# Patient Record
Sex: Female | Born: 1945 | Race: White | Hispanic: No | Smoking: Former smoker
Health system: Southern US, Community
[De-identification: ages and names within clinical notes are randomized; demographics above are authoritative.]

## PROBLEM LIST (undated history)

## (undated) DIAGNOSIS — F32A Depression, unspecified: Secondary | ICD-10-CM

## (undated) DIAGNOSIS — K219 Gastro-esophageal reflux disease without esophagitis: Secondary | ICD-10-CM

## (undated) DIAGNOSIS — E785 Hyperlipidemia, unspecified: Secondary | ICD-10-CM

## (undated) DIAGNOSIS — M810 Age-related osteoporosis without current pathological fracture: Secondary | ICD-10-CM

## (undated) DIAGNOSIS — Z800 Family history of malignant neoplasm of digestive organs: Secondary | ICD-10-CM

## (undated) DIAGNOSIS — C50919 Malignant neoplasm of unspecified site of unspecified female breast: Secondary | ICD-10-CM

## (undated) DIAGNOSIS — Z803 Family history of malignant neoplasm of breast: Secondary | ICD-10-CM

## (undated) DIAGNOSIS — T8859XA Other complications of anesthesia, initial encounter: Secondary | ICD-10-CM

## (undated) DIAGNOSIS — Z86718 Personal history of other venous thrombosis and embolism: Secondary | ICD-10-CM

## (undated) DIAGNOSIS — D0510 Intraductal carcinoma in situ of unspecified breast: Secondary | ICD-10-CM

## (undated) DIAGNOSIS — K5792 Diverticulitis of intestine, part unspecified, without perforation or abscess without bleeding: Secondary | ICD-10-CM

## (undated) DIAGNOSIS — I10 Essential (primary) hypertension: Secondary | ICD-10-CM

## (undated) DIAGNOSIS — M1990 Unspecified osteoarthritis, unspecified site: Secondary | ICD-10-CM

## (undated) DIAGNOSIS — F419 Anxiety disorder, unspecified: Secondary | ICD-10-CM

## (undated) DIAGNOSIS — Z8489 Family history of other specified conditions: Secondary | ICD-10-CM

## (undated) DIAGNOSIS — Z9889 Other specified postprocedural states: Secondary | ICD-10-CM

## (undated) HISTORY — DX: Gastro-esophageal reflux disease without esophagitis: K21.9

## (undated) HISTORY — DX: Essential (primary) hypertension: I10

## (undated) HISTORY — PX: ABDOMINAL ADHESION SURGERY: SHX90

## (undated) HISTORY — DX: Diverticulitis of intestine, part unspecified, without perforation or abscess without bleeding: K57.92

## (undated) HISTORY — DX: Malignant neoplasm of unspecified site of unspecified female breast: C50.919

## (undated) HISTORY — PX: BREAST SURGERY: SHX581

## (undated) HISTORY — DX: Unspecified osteoarthritis, unspecified site: M19.90

## (undated) HISTORY — DX: Family history of malignant neoplasm of digestive organs: Z80.0

## (undated) HISTORY — DX: Family history of malignant neoplasm of breast: Z80.3

## (undated) HISTORY — DX: Personal history of other venous thrombosis and embolism: Z86.718

## (undated) HISTORY — DX: Age-related osteoporosis without current pathological fracture: M81.0

## (undated) HISTORY — PX: BREAST EXCISIONAL BIOPSY: SUR124

## (undated) HISTORY — DX: Hyperlipidemia, unspecified: E78.5

## (undated) HISTORY — DX: Intraductal carcinoma in situ of unspecified breast: D05.10

---

## 1993-10-13 HISTORY — PX: ABDOMINAL HYSTERECTOMY: SHX81

## 1994-10-13 HISTORY — PX: MASTECTOMY: SHX3

## 1998-03-22 DIAGNOSIS — D0510 Intraductal carcinoma in situ of unspecified breast: Secondary | ICD-10-CM

## 1998-03-22 HISTORY — DX: Intraductal carcinoma in situ of unspecified breast: D05.10

## 1999-03-27 ENCOUNTER — Ambulatory Visit (HOSPITAL_COMMUNITY): Admission: RE | Admit: 1999-03-27 | Discharge: 1999-03-27 | Payer: Self-pay | Admitting: Gastroenterology

## 1999-06-05 ENCOUNTER — Other Ambulatory Visit: Admission: RE | Admit: 1999-06-05 | Discharge: 1999-06-05 | Payer: Self-pay | Admitting: Obstetrics & Gynecology

## 1999-12-06 ENCOUNTER — Ambulatory Visit (HOSPITAL_COMMUNITY): Admission: RE | Admit: 1999-12-06 | Discharge: 1999-12-06 | Payer: Self-pay | Admitting: Family Medicine

## 2000-02-03 ENCOUNTER — Encounter: Admission: RE | Admit: 2000-02-03 | Discharge: 2000-02-03 | Payer: Self-pay | Admitting: General Surgery

## 2000-02-03 ENCOUNTER — Encounter: Payer: Self-pay | Admitting: General Surgery

## 2000-08-21 ENCOUNTER — Other Ambulatory Visit: Admission: RE | Admit: 2000-08-21 | Discharge: 2000-08-21 | Payer: Self-pay | Admitting: Obstetrics & Gynecology

## 2001-02-03 ENCOUNTER — Encounter: Admission: RE | Admit: 2001-02-03 | Discharge: 2001-02-03 | Payer: Self-pay | Admitting: General Surgery

## 2001-02-03 ENCOUNTER — Encounter: Payer: Self-pay | Admitting: General Surgery

## 2002-03-01 ENCOUNTER — Encounter: Payer: Self-pay | Admitting: General Surgery

## 2002-03-01 ENCOUNTER — Encounter: Admission: RE | Admit: 2002-03-01 | Discharge: 2002-03-01 | Payer: Self-pay | Admitting: General Surgery

## 2002-07-18 ENCOUNTER — Ambulatory Visit (HOSPITAL_COMMUNITY): Admission: RE | Admit: 2002-07-18 | Discharge: 2002-07-18 | Payer: Self-pay | Admitting: Gastroenterology

## 2002-08-26 ENCOUNTER — Other Ambulatory Visit: Admission: RE | Admit: 2002-08-26 | Discharge: 2002-08-26 | Payer: Self-pay | Admitting: Obstetrics & Gynecology

## 2003-03-07 ENCOUNTER — Encounter: Payer: Self-pay | Admitting: General Surgery

## 2003-03-07 ENCOUNTER — Encounter: Admission: RE | Admit: 2003-03-07 | Discharge: 2003-03-07 | Payer: Self-pay | Admitting: General Surgery

## 2004-03-05 ENCOUNTER — Encounter: Admission: RE | Admit: 2004-03-05 | Discharge: 2004-03-05 | Payer: Self-pay | Admitting: General Surgery

## 2004-03-28 ENCOUNTER — Other Ambulatory Visit: Admission: RE | Admit: 2004-03-28 | Discharge: 2004-03-28 | Payer: Self-pay | Admitting: Obstetrics & Gynecology

## 2005-03-11 ENCOUNTER — Encounter: Admission: RE | Admit: 2005-03-11 | Discharge: 2005-03-11 | Payer: Self-pay | Admitting: General Surgery

## 2005-05-15 ENCOUNTER — Other Ambulatory Visit: Admission: RE | Admit: 2005-05-15 | Discharge: 2005-05-15 | Payer: Self-pay | Admitting: Obstetrics & Gynecology

## 2006-03-18 ENCOUNTER — Encounter: Admission: RE | Admit: 2006-03-18 | Discharge: 2006-03-18 | Payer: Self-pay | Admitting: General Surgery

## 2006-04-14 ENCOUNTER — Emergency Department: Payer: Self-pay | Admitting: Emergency Medicine

## 2006-04-14 ENCOUNTER — Other Ambulatory Visit: Payer: Self-pay

## 2006-08-15 ENCOUNTER — Other Ambulatory Visit: Payer: Self-pay

## 2006-08-15 ENCOUNTER — Observation Stay: Payer: Self-pay | Admitting: Internal Medicine

## 2006-10-08 ENCOUNTER — Encounter: Admission: RE | Admit: 2006-10-08 | Discharge: 2006-10-08 | Payer: Self-pay | Admitting: Gastroenterology

## 2007-03-22 ENCOUNTER — Encounter: Admission: RE | Admit: 2007-03-22 | Discharge: 2007-03-22 | Payer: Self-pay | Admitting: General Surgery

## 2007-07-04 ENCOUNTER — Ambulatory Visit: Payer: Self-pay | Admitting: Internal Medicine

## 2007-08-18 ENCOUNTER — Encounter: Admission: RE | Admit: 2007-08-18 | Discharge: 2007-08-18 | Payer: Self-pay | Admitting: Obstetrics & Gynecology

## 2007-08-22 ENCOUNTER — Encounter: Admission: RE | Admit: 2007-08-22 | Discharge: 2007-08-22 | Payer: Self-pay | Admitting: Obstetrics & Gynecology

## 2008-03-20 ENCOUNTER — Encounter: Admission: RE | Admit: 2008-03-20 | Discharge: 2008-03-20 | Payer: Self-pay | Admitting: Surgery

## 2008-09-26 ENCOUNTER — Ambulatory Visit: Payer: Self-pay | Admitting: Orthopedic Surgery

## 2009-03-27 ENCOUNTER — Encounter: Admission: RE | Admit: 2009-03-27 | Discharge: 2009-03-27 | Payer: Self-pay | Admitting: Surgery

## 2009-10-03 ENCOUNTER — Ambulatory Visit: Payer: Self-pay | Admitting: Family Medicine

## 2009-11-16 ENCOUNTER — Ambulatory Visit: Payer: Self-pay | Admitting: Internal Medicine

## 2009-12-21 ENCOUNTER — Ambulatory Visit: Payer: Self-pay | Admitting: Family Medicine

## 2010-01-30 ENCOUNTER — Encounter: Admission: RE | Admit: 2010-01-30 | Discharge: 2010-01-30 | Payer: Self-pay | Admitting: Gastroenterology

## 2010-03-28 ENCOUNTER — Encounter: Admission: RE | Admit: 2010-03-28 | Discharge: 2010-03-28 | Payer: Self-pay | Admitting: Surgery

## 2010-04-29 ENCOUNTER — Encounter: Admission: RE | Admit: 2010-04-29 | Discharge: 2010-04-29 | Payer: Self-pay | Admitting: Surgery

## 2010-10-22 ENCOUNTER — Emergency Department: Payer: Self-pay | Admitting: Emergency Medicine

## 2010-11-12 ENCOUNTER — Ambulatory Visit: Payer: Self-pay | Admitting: Internal Medicine

## 2011-02-25 ENCOUNTER — Encounter (INDEPENDENT_AMBULATORY_CARE_PROVIDER_SITE_OTHER): Payer: Self-pay | Admitting: Surgery

## 2011-03-04 ENCOUNTER — Other Ambulatory Visit: Payer: Self-pay | Admitting: Surgery

## 2011-03-04 DIAGNOSIS — Z1231 Encounter for screening mammogram for malignant neoplasm of breast: Secondary | ICD-10-CM

## 2011-04-04 ENCOUNTER — Encounter (INDEPENDENT_AMBULATORY_CARE_PROVIDER_SITE_OTHER): Payer: Self-pay | Admitting: Surgery

## 2011-04-10 ENCOUNTER — Ambulatory Visit
Admission: RE | Admit: 2011-04-10 | Discharge: 2011-04-10 | Disposition: A | Discharge status: Home / Self Care | Payer: Self-pay | Source: Ambulatory Visit | Attending: Surgery | Admitting: Surgery

## 2011-04-10 DIAGNOSIS — Z1231 Encounter for screening mammogram for malignant neoplasm of breast: Secondary | ICD-10-CM

## 2011-04-17 ENCOUNTER — Ambulatory Visit: Payer: Self-pay

## 2011-04-17 ENCOUNTER — Encounter (INDEPENDENT_AMBULATORY_CARE_PROVIDER_SITE_OTHER): Payer: BC Managed Care – PPO | Admitting: Surgery

## 2011-04-22 ENCOUNTER — Ambulatory Visit (INDEPENDENT_AMBULATORY_CARE_PROVIDER_SITE_OTHER): Payer: BC Managed Care – PPO | Admitting: Surgery

## 2011-04-22 ENCOUNTER — Encounter (INDEPENDENT_AMBULATORY_CARE_PROVIDER_SITE_OTHER): Payer: Self-pay | Admitting: Surgery

## 2011-04-22 VITALS — BP 142/94 | HR 80 | Temp 97.6°F

## 2011-04-22 DIAGNOSIS — Z853 Personal history of malignant neoplasm of breast: Secondary | ICD-10-CM

## 2011-04-22 DIAGNOSIS — D0510 Intraductal carcinoma in situ of unspecified breast: Secondary | ICD-10-CM

## 2011-04-22 DIAGNOSIS — D0590 Unspecified type of carcinoma in situ of unspecified breast: Secondary | ICD-10-CM

## 2011-04-22 NOTE — Progress Notes (Signed)
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 She has no problems or concerns on either side.  PFSH She has had no significant changes since the last visit here.  ROS There have been no significant changes since the last visit here  General: The patient is alert, oriented, generally healty appearing, NAD. Mood and affect are normal.  Breasts : Right breast somewhat irregular but nodominant mass. S/P Tram on Left Lymphatics: She has no axillary or supraclavicular adenopathy on either side.  Extremities: Full ROM of the surgical side with no lymphedema noted.  Data Reviewed: Mammogram wnl  Impression: Stable exam  Plan: Return next year

## 2012-01-13 ENCOUNTER — Ambulatory Visit: Payer: Self-pay | Admitting: Internal Medicine

## 2012-01-25 ENCOUNTER — Ambulatory Visit: Payer: Self-pay | Admitting: Family Medicine

## 2012-03-15 ENCOUNTER — Other Ambulatory Visit (INDEPENDENT_AMBULATORY_CARE_PROVIDER_SITE_OTHER): Payer: Self-pay | Admitting: Surgery

## 2012-03-15 DIAGNOSIS — Z1231 Encounter for screening mammogram for malignant neoplasm of breast: Secondary | ICD-10-CM

## 2012-04-19 ENCOUNTER — Ambulatory Visit
Admission: RE | Admit: 2012-04-19 | Discharge: 2012-04-19 | Disposition: A | Discharge status: Home / Self Care | Payer: BC Managed Care – PPO | Source: Ambulatory Visit | Attending: Surgery | Admitting: Surgery

## 2012-04-19 DIAGNOSIS — Z1231 Encounter for screening mammogram for malignant neoplasm of breast: Secondary | ICD-10-CM

## 2012-04-22 ENCOUNTER — Encounter (INDEPENDENT_AMBULATORY_CARE_PROVIDER_SITE_OTHER): Payer: Self-pay | Admitting: Surgery

## 2012-04-22 ENCOUNTER — Ambulatory Visit (INDEPENDENT_AMBULATORY_CARE_PROVIDER_SITE_OTHER): Payer: BC Managed Care – PPO | Admitting: Surgery

## 2012-04-22 VITALS — BP 138/96 | HR 60 | Resp 14 | Ht 66.0 in | Wt 134.0 lb

## 2012-04-22 DIAGNOSIS — Z853 Personal history of malignant neoplasm of breast: Secondary | ICD-10-CM

## 2012-04-22 NOTE — Progress Notes (Signed)
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 She has no problems or concerns on either side.  PFSH She has had no significant changes since the last visit here.  ROS There have been no significant changes since the last visit here except the development of some vitelligo  General: The patient is alert, oriented, generally healty appearing, NAD. Mood and affect are normal.  Breasts : Right breast somewhat irregular but nodominant mass. S/P Tram on Left Lymphatics: She has no axillary or supraclavicular adenopathy on either side.  Extremities: Full ROM of the surgical side with no lymphedema noted.  Data Reviewed: Mammogram wnl  Impression: Stable exam  Plan: Return next year. I told her she would be fine just seeing her ob-gyn but she would like to be seen here as well

## 2012-04-22 NOTE — Patient Instructions (Signed)
Continue annual mammograms and see us again in a year  

## 2012-05-24 ENCOUNTER — Other Ambulatory Visit: Payer: Self-pay | Admitting: Gastroenterology

## 2012-05-24 DIAGNOSIS — R109 Unspecified abdominal pain: Secondary | ICD-10-CM

## 2012-05-26 ENCOUNTER — Ambulatory Visit
Admission: RE | Admit: 2012-05-26 | Discharge: 2012-05-26 | Disposition: A | Discharge status: Home / Self Care | Payer: BC Managed Care – PPO | Source: Ambulatory Visit | Attending: Gastroenterology | Admitting: Gastroenterology

## 2012-05-26 DIAGNOSIS — R109 Unspecified abdominal pain: Secondary | ICD-10-CM

## 2012-06-02 ENCOUNTER — Other Ambulatory Visit: Payer: Self-pay | Admitting: Gastroenterology

## 2012-06-02 DIAGNOSIS — R140 Abdominal distension (gaseous): Secondary | ICD-10-CM

## 2012-06-02 DIAGNOSIS — R109 Unspecified abdominal pain: Secondary | ICD-10-CM

## 2012-06-04 ENCOUNTER — Ambulatory Visit
Admission: RE | Admit: 2012-06-04 | Discharge: 2012-06-04 | Disposition: A | Discharge status: Home / Self Care | Payer: BC Managed Care – PPO | Source: Ambulatory Visit | Attending: Gastroenterology | Admitting: Gastroenterology

## 2012-06-04 ENCOUNTER — Other Ambulatory Visit: Payer: BC Managed Care – PPO

## 2012-06-04 DIAGNOSIS — R109 Unspecified abdominal pain: Secondary | ICD-10-CM

## 2012-06-04 DIAGNOSIS — R140 Abdominal distension (gaseous): Secondary | ICD-10-CM

## 2012-06-04 MED ORDER — IOHEXOL 300 MG/ML  SOLN
100.0000 mL | Freq: Once | INTRAMUSCULAR | Status: AC | PRN
  Administered 2012-06-04: 100 mL via INTRAVENOUS

## 2012-09-29 DIAGNOSIS — N952 Postmenopausal atrophic vaginitis: Secondary | ICD-10-CM | POA: Diagnosis not present

## 2012-09-29 DIAGNOSIS — R310 Gross hematuria: Secondary | ICD-10-CM | POA: Diagnosis not present

## 2012-10-04 DIAGNOSIS — R3129 Other microscopic hematuria: Secondary | ICD-10-CM | POA: Diagnosis not present

## 2012-10-26 DIAGNOSIS — N3946 Mixed incontinence: Secondary | ICD-10-CM | POA: Diagnosis not present

## 2012-10-26 DIAGNOSIS — R319 Hematuria, unspecified: Secondary | ICD-10-CM | POA: Diagnosis not present

## 2012-11-29 DIAGNOSIS — M531 Cervicobrachial syndrome: Secondary | ICD-10-CM | POA: Diagnosis not present

## 2012-11-29 DIAGNOSIS — M47812 Spondylosis without myelopathy or radiculopathy, cervical region: Secondary | ICD-10-CM | POA: Diagnosis not present

## 2012-12-27 DIAGNOSIS — N3946 Mixed incontinence: Secondary | ICD-10-CM | POA: Diagnosis not present

## 2012-12-27 DIAGNOSIS — R319 Hematuria, unspecified: Secondary | ICD-10-CM | POA: Diagnosis not present

## 2013-02-15 DIAGNOSIS — R319 Hematuria, unspecified: Secondary | ICD-10-CM | POA: Diagnosis not present

## 2013-02-15 DIAGNOSIS — N950 Postmenopausal bleeding: Secondary | ICD-10-CM | POA: Diagnosis not present

## 2013-02-15 DIAGNOSIS — N3946 Mixed incontinence: Secondary | ICD-10-CM | POA: Diagnosis not present

## 2013-03-03 DIAGNOSIS — L80 Vitiligo: Secondary | ICD-10-CM | POA: Diagnosis not present

## 2013-03-03 DIAGNOSIS — N3946 Mixed incontinence: Secondary | ICD-10-CM | POA: Diagnosis not present

## 2013-03-09 ENCOUNTER — Other Ambulatory Visit: Payer: Self-pay

## 2013-03-09 DIAGNOSIS — Z1231 Encounter for screening mammogram for malignant neoplasm of breast: Secondary | ICD-10-CM

## 2013-03-16 DIAGNOSIS — L80 Vitiligo: Secondary | ICD-10-CM | POA: Diagnosis not present

## 2013-03-23 DIAGNOSIS — L80 Vitiligo: Secondary | ICD-10-CM | POA: Diagnosis not present

## 2013-03-30 DIAGNOSIS — L80 Vitiligo: Secondary | ICD-10-CM | POA: Diagnosis not present

## 2013-04-05 DIAGNOSIS — R32 Unspecified urinary incontinence: Secondary | ICD-10-CM | POA: Diagnosis not present

## 2013-04-05 DIAGNOSIS — L80 Vitiligo: Secondary | ICD-10-CM | POA: Diagnosis not present

## 2013-04-11 DIAGNOSIS — L80 Vitiligo: Secondary | ICD-10-CM | POA: Diagnosis not present

## 2013-04-18 ENCOUNTER — Ambulatory Visit
Admission: RE | Admit: 2013-04-18 | Discharge: 2013-04-18 | Disposition: A | Discharge status: Home / Self Care | Payer: Medicare Other | Source: Ambulatory Visit | Attending: Family Medicine | Admitting: Family Medicine

## 2013-04-18 ENCOUNTER — Other Ambulatory Visit: Payer: Self-pay | Admitting: Family Medicine

## 2013-04-18 DIAGNOSIS — R072 Precordial pain: Secondary | ICD-10-CM | POA: Diagnosis not present

## 2013-04-18 DIAGNOSIS — R071 Chest pain on breathing: Secondary | ICD-10-CM | POA: Diagnosis not present

## 2013-04-18 DIAGNOSIS — W19XXXA Unspecified fall, initial encounter: Secondary | ICD-10-CM

## 2013-04-18 DIAGNOSIS — M545 Low back pain: Secondary | ICD-10-CM | POA: Diagnosis not present

## 2013-04-18 DIAGNOSIS — M549 Dorsalgia, unspecified: Secondary | ICD-10-CM | POA: Diagnosis not present

## 2013-04-18 DIAGNOSIS — M47812 Spondylosis without myelopathy or radiculopathy, cervical region: Secondary | ICD-10-CM | POA: Diagnosis not present

## 2013-04-18 DIAGNOSIS — M810 Age-related osteoporosis without current pathological fracture: Secondary | ICD-10-CM | POA: Diagnosis not present

## 2013-04-18 DIAGNOSIS — IMO0002 Reserved for concepts with insufficient information to code with codable children: Secondary | ICD-10-CM | POA: Diagnosis not present

## 2013-04-21 DIAGNOSIS — L80 Vitiligo: Secondary | ICD-10-CM | POA: Diagnosis not present

## 2013-04-25 ENCOUNTER — Ambulatory Visit
Admission: RE | Admit: 2013-04-25 | Discharge: 2013-04-25 | Disposition: A | Discharge status: Home / Self Care | Payer: BC Managed Care – PPO | Source: Ambulatory Visit

## 2013-04-25 ENCOUNTER — Other Ambulatory Visit: Payer: Self-pay

## 2013-04-25 DIAGNOSIS — Z1231 Encounter for screening mammogram for malignant neoplasm of breast: Secondary | ICD-10-CM

## 2013-04-27 DIAGNOSIS — L80 Vitiligo: Secondary | ICD-10-CM | POA: Diagnosis not present

## 2013-04-28 ENCOUNTER — Encounter (INDEPENDENT_AMBULATORY_CARE_PROVIDER_SITE_OTHER): Payer: Self-pay | Admitting: Surgery

## 2013-04-28 ENCOUNTER — Ambulatory Visit (INDEPENDENT_AMBULATORY_CARE_PROVIDER_SITE_OTHER): Payer: BC Managed Care – PPO | Admitting: Surgery

## 2013-04-28 VITALS — BP 150/82 | HR 88 | Resp 16 | Ht 66.5 in | Wt 136.0 lb

## 2013-04-28 DIAGNOSIS — D0512 Intraductal carcinoma in situ of left breast: Secondary | ICD-10-CM

## 2013-04-28 DIAGNOSIS — D0590 Unspecified type of carcinoma in situ of unspecified breast: Secondary | ICD-10-CM | POA: Diagnosis not present

## 2013-04-28 NOTE — Patient Instructions (Signed)
Continue annual mammograms  

## 2013-04-28 NOTE — Progress Notes (Signed)
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 She has no problems or concerns on either side.  PFSH She has had no significant changes since the last visit here.  ROS There have been no significant changes since the last visit here except the development of some vitelligo  General: The patient is alert, oriented, generally healty appearing, NAD. Mood and affect are normal.  Breasts : Right breast somewhat irregular but nodominant mass. S/P Tram on Left Lymphatics: She has no axillary or supraclavicular adenopathy on either side.  Extremities: Full ROM of the surgical side with no lymphedema noted.  Data Reviewed: Mammogram: DIGITAL SCREENING UNILATERAL RIGHT MAMMOGRAM WITH CAD  Comparison: Previous exam(s).  FINDINGS:  ACR Breast Density Category c: The breast tissue is  heterogeneously dense, which may obscure small masses.  There are no findings suspicious for malignancy.  Images were processed with CAD.  IMPRESSION:  No mammographic evidence of malignancy.  A result letter of this screening mammogram will be mailed directly  to the patient.  RECOMMENDATION:  Screening mammogram in one year. (Code:SM-B-01Y)  BI-RADS CATEGORY 1: Negative.  Original Report Authenticated By: Sherian Rein, M.D.   Impression: Stable exam  Plan: Return next year. I told her she would be fine just seeing her ob-gyn but she would like to be seen here as well

## 2013-05-02 DIAGNOSIS — L80 Vitiligo: Secondary | ICD-10-CM | POA: Diagnosis not present

## 2013-05-02 DIAGNOSIS — R32 Unspecified urinary incontinence: Secondary | ICD-10-CM | POA: Diagnosis not present

## 2013-05-13 DIAGNOSIS — L80 Vitiligo: Secondary | ICD-10-CM | POA: Diagnosis not present

## 2013-05-17 DIAGNOSIS — L80 Vitiligo: Secondary | ICD-10-CM | POA: Diagnosis not present

## 2013-05-19 DIAGNOSIS — M810 Age-related osteoporosis without current pathological fracture: Secondary | ICD-10-CM | POA: Diagnosis not present

## 2013-05-26 DIAGNOSIS — L80 Vitiligo: Secondary | ICD-10-CM | POA: Diagnosis not present

## 2013-06-02 DIAGNOSIS — L80 Vitiligo: Secondary | ICD-10-CM | POA: Diagnosis not present

## 2013-06-10 DIAGNOSIS — L80 Vitiligo: Secondary | ICD-10-CM | POA: Diagnosis not present

## 2013-06-16 DIAGNOSIS — L80 Vitiligo: Secondary | ICD-10-CM | POA: Diagnosis not present

## 2013-06-20 DIAGNOSIS — L80 Vitiligo: Secondary | ICD-10-CM | POA: Diagnosis not present

## 2013-06-30 DIAGNOSIS — N3946 Mixed incontinence: Secondary | ICD-10-CM | POA: Diagnosis not present

## 2013-07-13 DIAGNOSIS — M62838 Other muscle spasm: Secondary | ICD-10-CM | POA: Diagnosis not present

## 2013-08-08 DIAGNOSIS — M6281 Muscle weakness (generalized): Secondary | ICD-10-CM | POA: Diagnosis not present

## 2013-09-05 DIAGNOSIS — K219 Gastro-esophageal reflux disease without esophagitis: Secondary | ICD-10-CM | POA: Diagnosis not present

## 2013-09-05 DIAGNOSIS — Z23 Encounter for immunization: Secondary | ICD-10-CM | POA: Diagnosis not present

## 2013-09-05 DIAGNOSIS — E7800 Pure hypercholesterolemia, unspecified: Secondary | ICD-10-CM | POA: Diagnosis not present

## 2013-09-05 DIAGNOSIS — M810 Age-related osteoporosis without current pathological fracture: Secondary | ICD-10-CM | POA: Diagnosis not present

## 2013-09-05 DIAGNOSIS — I10 Essential (primary) hypertension: Secondary | ICD-10-CM | POA: Diagnosis not present

## 2013-09-05 DIAGNOSIS — E559 Vitamin D deficiency, unspecified: Secondary | ICD-10-CM | POA: Diagnosis not present

## 2013-09-15 DIAGNOSIS — I10 Essential (primary) hypertension: Secondary | ICD-10-CM | POA: Diagnosis not present

## 2013-09-15 DIAGNOSIS — E559 Vitamin D deficiency, unspecified: Secondary | ICD-10-CM | POA: Diagnosis not present

## 2013-09-15 DIAGNOSIS — E7800 Pure hypercholesterolemia, unspecified: Secondary | ICD-10-CM | POA: Diagnosis not present

## 2013-09-22 DIAGNOSIS — L820 Inflamed seborrheic keratosis: Secondary | ICD-10-CM | POA: Diagnosis not present

## 2013-11-09 DIAGNOSIS — Z853 Personal history of malignant neoplasm of breast: Secondary | ICD-10-CM | POA: Diagnosis not present

## 2013-11-09 DIAGNOSIS — Z124 Encounter for screening for malignant neoplasm of cervix: Secondary | ICD-10-CM | POA: Diagnosis not present

## 2013-11-09 DIAGNOSIS — Z130 Encounter for screening for diseases of the blood and blood-forming organs and certain disorders involving the immune mechanism: Secondary | ICD-10-CM | POA: Diagnosis not present

## 2013-11-09 DIAGNOSIS — Z01419 Encounter for gynecological examination (general) (routine) without abnormal findings: Secondary | ICD-10-CM | POA: Diagnosis not present

## 2013-11-09 DIAGNOSIS — Z803 Family history of malignant neoplasm of breast: Secondary | ICD-10-CM | POA: Diagnosis not present

## 2013-11-23 ENCOUNTER — Emergency Department: Payer: Self-pay | Admitting: Emergency Medicine

## 2013-11-23 DIAGNOSIS — Z79899 Other long term (current) drug therapy: Secondary | ICD-10-CM | POA: Diagnosis not present

## 2013-11-23 DIAGNOSIS — M549 Dorsalgia, unspecified: Secondary | ICD-10-CM | POA: Diagnosis not present

## 2013-11-23 DIAGNOSIS — K5730 Diverticulosis of large intestine without perforation or abscess without bleeding: Secondary | ICD-10-CM | POA: Diagnosis not present

## 2013-11-23 DIAGNOSIS — Z853 Personal history of malignant neoplasm of breast: Secondary | ICD-10-CM | POA: Diagnosis not present

## 2013-11-23 DIAGNOSIS — F909 Attention-deficit hyperactivity disorder, unspecified type: Secondary | ICD-10-CM | POA: Diagnosis not present

## 2013-11-23 DIAGNOSIS — F3289 Other specified depressive episodes: Secondary | ICD-10-CM | POA: Diagnosis not present

## 2013-11-23 DIAGNOSIS — R109 Unspecified abdominal pain: Secondary | ICD-10-CM | POA: Diagnosis not present

## 2013-11-23 DIAGNOSIS — F329 Major depressive disorder, single episode, unspecified: Secondary | ICD-10-CM | POA: Diagnosis not present

## 2013-11-23 DIAGNOSIS — R110 Nausea: Secondary | ICD-10-CM | POA: Diagnosis not present

## 2013-11-23 DIAGNOSIS — I10 Essential (primary) hypertension: Secondary | ICD-10-CM | POA: Diagnosis not present

## 2013-11-23 LAB — URINALYSIS, COMPLETE
Bacteria: NONE SEEN
Bilirubin,UR: NEGATIVE
Glucose,UR: NEGATIVE mg/dL (ref 0–75)
KETONE: NEGATIVE
Leukocyte Esterase: NEGATIVE
Nitrite: NEGATIVE
Ph: 6 (ref 4.5–8.0)
Protein: NEGATIVE
RBC,UR: 1 /HPF (ref 0–5)
Specific Gravity: 1.02 (ref 1.003–1.030)
Squamous Epithelial: <1

## 2013-11-23 LAB — COMPREHENSIVE METABOLIC PANEL
ALK PHOS: 101 U/L
ALT: 28 U/L (ref 12–78)
AST: 19 U/L (ref 15–37)
Albumin: 3.9 g/dL (ref 3.4–5.0)
Anion Gap: 7 (ref 7–16)
BILIRUBIN TOTAL: 0.5 mg/dL (ref 0.2–1.0)
BUN: 13 mg/dL (ref 7–18)
CALCIUM: 9 mg/dL (ref 8.5–10.1)
CO2: 28 mmol/L (ref 21–32)
CREATININE: 0.66 mg/dL (ref 0.60–1.30)
Chloride: 102 mmol/L (ref 98–107)
Glucose: 97 mg/dL (ref 65–99)
OSMOLALITY: 274 (ref 275–301)
Potassium: 3.6 mmol/L (ref 3.5–5.1)
SODIUM: 137 mmol/L (ref 136–145)
TOTAL PROTEIN: 7.2 g/dL (ref 6.4–8.2)

## 2013-11-23 LAB — CBC WITH DIFFERENTIAL/PLATELET
Basophil #: 0.1 10*3/uL (ref 0.0–0.1)
Basophil %: 0.9 %
EOS ABS: 0.2 10*3/uL (ref 0.0–0.7)
Eosinophil %: 2.9 %
HCT: 46.1 % (ref 35.0–47.0)
HGB: 15.4 g/dL (ref 12.0–16.0)
Lymphocyte #: 1.8 10*3/uL (ref 1.0–3.6)
Lymphocyte %: 28.4 %
MCH: 28.9 pg (ref 26.0–34.0)
MCHC: 33.5 g/dL (ref 32.0–36.0)
MCV: 86 fL (ref 80–100)
MONO ABS: 0.6 x10 3/mm (ref 0.2–0.9)
MONOS PCT: 9 %
NEUTROS ABS: 3.7 10*3/uL (ref 1.4–6.5)
NEUTROS PCT: 58.8 %
Platelet: 232 10*3/uL (ref 150–440)
RBC: 5.34 10*6/uL — ABNORMAL HIGH (ref 3.80–5.20)
RDW: 12.7 % (ref 11.5–14.5)
WBC: 6.3 10*3/uL (ref 3.6–11.0)

## 2013-11-23 LAB — PROTIME-INR
INR: 1
Prothrombin Time: 13.2 secs (ref 11.5–14.7)

## 2013-11-30 DIAGNOSIS — IMO0002 Reserved for concepts with insufficient information to code with codable children: Secondary | ICD-10-CM | POA: Diagnosis not present

## 2014-02-06 DIAGNOSIS — H2510 Age-related nuclear cataract, unspecified eye: Secondary | ICD-10-CM | POA: Diagnosis not present

## 2014-04-06 ENCOUNTER — Other Ambulatory Visit: Payer: Self-pay

## 2014-04-06 DIAGNOSIS — Z9012 Acquired absence of left breast and nipple: Secondary | ICD-10-CM

## 2014-04-06 DIAGNOSIS — Z1231 Encounter for screening mammogram for malignant neoplasm of breast: Secondary | ICD-10-CM

## 2014-05-08 DIAGNOSIS — L259 Unspecified contact dermatitis, unspecified cause: Secondary | ICD-10-CM | POA: Diagnosis not present

## 2014-05-08 DIAGNOSIS — IMO0002 Reserved for concepts with insufficient information to code with codable children: Secondary | ICD-10-CM | POA: Diagnosis not present

## 2014-05-17 DIAGNOSIS — M810 Age-related osteoporosis without current pathological fracture: Secondary | ICD-10-CM | POA: Diagnosis not present

## 2014-05-17 DIAGNOSIS — F329 Major depressive disorder, single episode, unspecified: Secondary | ICD-10-CM | POA: Diagnosis not present

## 2014-05-17 DIAGNOSIS — E7800 Pure hypercholesterolemia, unspecified: Secondary | ICD-10-CM | POA: Diagnosis not present

## 2014-05-17 DIAGNOSIS — F3289 Other specified depressive episodes: Secondary | ICD-10-CM | POA: Diagnosis not present

## 2014-05-17 DIAGNOSIS — E559 Vitamin D deficiency, unspecified: Secondary | ICD-10-CM | POA: Diagnosis not present

## 2014-05-17 DIAGNOSIS — I10 Essential (primary) hypertension: Secondary | ICD-10-CM | POA: Diagnosis not present

## 2014-05-17 DIAGNOSIS — K219 Gastro-esophageal reflux disease without esophagitis: Secondary | ICD-10-CM | POA: Diagnosis not present

## 2014-06-05 ENCOUNTER — Ambulatory Visit
Admission: RE | Admit: 2014-06-05 | Discharge: 2014-06-05 | Disposition: A | Discharge status: Home / Self Care | Payer: Medicare Other | Source: Ambulatory Visit

## 2014-06-05 DIAGNOSIS — Z1231 Encounter for screening mammogram for malignant neoplasm of breast: Secondary | ICD-10-CM

## 2014-06-05 DIAGNOSIS — Z9012 Acquired absence of left breast and nipple: Secondary | ICD-10-CM

## 2014-06-08 ENCOUNTER — Ambulatory Visit (INDEPENDENT_AMBULATORY_CARE_PROVIDER_SITE_OTHER): Payer: BC Managed Care – PPO | Admitting: General Surgery

## 2014-07-03 ENCOUNTER — Ambulatory Visit (INDEPENDENT_AMBULATORY_CARE_PROVIDER_SITE_OTHER): Payer: BC Managed Care – PPO | Admitting: General Surgery

## 2014-07-03 DIAGNOSIS — D0590 Unspecified type of carcinoma in situ of unspecified breast: Secondary | ICD-10-CM | POA: Diagnosis not present

## 2014-07-21 DIAGNOSIS — N950 Postmenopausal bleeding: Secondary | ICD-10-CM | POA: Diagnosis not present

## 2014-07-25 ENCOUNTER — Ambulatory Visit (INDEPENDENT_AMBULATORY_CARE_PROVIDER_SITE_OTHER): Payer: BC Managed Care – PPO | Admitting: General Surgery

## 2014-08-03 DIAGNOSIS — R0789 Other chest pain: Secondary | ICD-10-CM | POA: Diagnosis not present

## 2014-08-03 DIAGNOSIS — N939 Abnormal uterine and vaginal bleeding, unspecified: Secondary | ICD-10-CM | POA: Diagnosis not present

## 2014-08-03 DIAGNOSIS — N898 Other specified noninflammatory disorders of vagina: Secondary | ICD-10-CM | POA: Diagnosis not present

## 2014-08-04 DIAGNOSIS — N898 Other specified noninflammatory disorders of vagina: Secondary | ICD-10-CM | POA: Diagnosis not present

## 2014-08-04 DIAGNOSIS — N842 Polyp of vagina: Secondary | ICD-10-CM | POA: Diagnosis not present

## 2014-08-04 DIAGNOSIS — N950 Postmenopausal bleeding: Secondary | ICD-10-CM | POA: Diagnosis not present

## 2014-08-04 DIAGNOSIS — N882 Stricture and stenosis of cervix uteri: Secondary | ICD-10-CM | POA: Diagnosis not present

## 2014-08-04 DIAGNOSIS — N895 Stricture and atresia of vagina: Secondary | ICD-10-CM | POA: Diagnosis not present

## 2014-08-08 ENCOUNTER — Ambulatory Visit (INDEPENDENT_AMBULATORY_CARE_PROVIDER_SITE_OTHER): Payer: Medicare Other | Admitting: Cardiology

## 2014-08-08 ENCOUNTER — Encounter: Payer: Self-pay | Admitting: Cardiology

## 2014-08-08 VITALS — BP 126/74 | HR 69 | Ht 65.0 in | Wt 140.0 lb

## 2014-08-08 DIAGNOSIS — I10 Essential (primary) hypertension: Secondary | ICD-10-CM

## 2014-08-08 DIAGNOSIS — E7800 Pure hypercholesterolemia, unspecified: Secondary | ICD-10-CM | POA: Insufficient documentation

## 2014-08-08 DIAGNOSIS — R0789 Other chest pain: Secondary | ICD-10-CM

## 2014-08-08 DIAGNOSIS — R0609 Other forms of dyspnea: Secondary | ICD-10-CM | POA: Diagnosis not present

## 2014-08-08 NOTE — Progress Notes (Signed)
[place] Date of Birth:  1946/08/31 [facility] [address] [place], [place]  [address] [phone]        Fax   [phone]   History of Present Illness: This pleasant 68 year old woman is seen at the request of Dr. Darcus Austin for evaluation of chest tightness.  The patient does not have any history of known heart disease she does have a history of hypercholesterolemia and high blood pressure.  She smoked about 40 years ago but not since.  She has a remote history of paroxysmal atrial tachycardia.  She has occasional episodes of hypoglycemia but is not known to be diabetic.  She has a past history of breast cancer of the left breast in 1997.  There has been no evidence of recurrence. She was in her usual state of health until recently when while pushing her granddaughter in a stroller up a hill she developed unusual exertional dyspnea.  The next day the patient was again exerting herself and this time felt a heavy pressure-like sensation in the precordial area without radiation to the arms or neck or back.  This was also associated with exertional dyspnea and was relieved by rest.  She was told by her daughter who is a Equities trader that occasionally the daughter will note that the patient develops unexplained pallor and mild diaphoresis. Her family history reveals her mother died at age 65 of heart failure.  Her father died at age 59 of colon cancer. Social history reveals that she is a retired Pharmacist, hospital at [employer].  She taught elementary school teaching. The patient is a widow.  Her husband died at age 68 of coronary artery disease.  Current Outpatient Prescriptions  Medication Sig Dispense Refill  . aspirin 81 MG tablet Take 81 mg by mouth daily.      Marland Kitchen atenolol (TENORMIN) 25 MG tablet Take 25 mg by mouth daily.      Marland Kitchen buPROPion (WELLBUTRIN XL) 300 MG 24 hr tablet Take 300 mg by mouth daily.        Marland Kitchen ELIDEL 1 % cream       .  estradiol (ESTRACE) 0.1 MG/GM vaginal cream Place 2 g vaginally 2 (two) times a week.        Marland Kitchen FLUoxetine (PROZAC) 10 MG capsule Take 10 mg by mouth daily.        Marland Kitchen omeprazole (PRILOSEC) 20 MG capsule Take 20 mg by mouth daily.      . polyethylene glycol (MIRALAX / GLYCOLAX) packet Take 17 g by mouth daily.      . rosuvastatin (CRESTOR) 20 MG tablet Take 20 mg by mouth daily.      . traZODone (DESYREL) 50 MG tablet Take 50 mg by mouth as needed for sleep.      . Vitamin D, Ergocalciferol, (DRISDOL) 50000 UNITS CAPS        No current facility-administered medications for this visit.    Allergies  Allergen Reactions  . Tape Rash    Patient Active Problem List   Diagnosis Date Noted  . Chest discomfort 08/08/2014  . Hypercholesterolemia 08/08/2014  . Essential hypertension 08/08/2014  . Dyspnea on exertion 08/08/2014  . [place] treated with mastectomy and tram reconstruction 03/22/1998    History  Smoking status  . Former Smoker  . Quit date: 04/28/1978  Smokeless tobacco  . Not on file    Comment: 30+ years ago, 1 pp Week for 1.5 years  History  Alcohol Use  . 0.0 oz/week  . 2-3 Glasses of wine per week    Comment: 1-2 GLASSES 3 TIMES A WEEK    Family History  Problem Relation Age of Onset  . Colon cancer Father   . Breast cancer Maternal Aunt   . Breast cancer Paternal Grandmother     Review of Systems: Constitutional: no fever chills diaphoresis or fatigue or change in weight.  Head and neck: no hearing loss, no epistaxis, no photophobia or visual disturbance. Respiratory: No cough, positive for exertional dyspnea and chest tightness Cardiovascular: No chest pain peripheral edema, palpitations. Gastrointestinal: No abdominal distention, no abdominal pain, no change in bowel habits hematochezia or melena. Genitourinary: No dysuria, no frequency, no urgency, no nocturia. Musculoskeletal:No arthralgias, no back pain, no gait disturbance or  myalgias. Neurological: No dizziness, no headaches, no numbness, no seizures, no syncope, no weakness, no tremors. Hematologic: No lymphadenopathy, no easy bruising. Psychiatric: No confusion, no hallucinations, no sleep disturbance.    Physical Exam: Filed Vitals:   08/08/14 1438  BP: 126/74  Pulse: 69  The patient appears to be in no distress.  Head and neck exam reveals that the pupils are equal and reactive.  The extraocular movements are full.  There is no scleral icterus.  Mouth and pharynx are benign.  No lymphadenopathy.  No carotid bruits.  The jugular venous pressure is normal.  Thyroid is not enlarged or tender.  Chest is clear to percussion and auscultation.  No rales or rhonchi.  Expansion of the chest is symmetrical.  Heart reveals no abnormal lift or heave.  First and second heart sounds are normal.  There is no murmur gallop rub or click.  The abdomen is soft and nontender.  Bowel sounds are normoactive.  There is no hepatosplenomegaly or mass.  There are no abdominal bruits.  Extremities reveal no phlebitis or edema.  Pedal pulses are good.  There is no cyanosis or clubbing.  Neurologic exam is normal strength and no lateralizing weakness.  No sensory deficits.  Integument reveals no rash  EKG today shows normal sinus rhythm and low voltage QRS and poor R-wave progression in V3, cannot rule out anterior infarct age undetermined.  There are also narrow Q waves in the inferior leads.  Assessment / Plan: 1. exertional chest tightness and dyspnea, rule out angina pectoris 2. Hypercholesterolemia 3. essential hypertension 4. remote history of breast cancer left breast 1997 5. history of GERD  Disposition: We will have the patient return for a treadmill Myoview stress test to evaluate her recent symptoms further. We will also start aspirin 81 mg daily. Many thanks for the opportunity to see this pleasant woman.  I will be in touch regarding the results of her Myoview  stress. Recheck here when necessary.

## 2014-08-08 NOTE — Patient Instructions (Signed)
START ASPIRIN 81 MG DAILY   Your physician has requested that you have en exercise stress myoview. For further information please visit [email or web address]. Please follow instruction sheet, as given.  Follow up as needed 

## 2014-08-14 DIAGNOSIS — L80 Vitiligo: Secondary | ICD-10-CM | POA: Diagnosis not present

## 2014-08-14 DIAGNOSIS — L03011 Cellulitis of right finger: Secondary | ICD-10-CM | POA: Diagnosis not present

## 2014-08-17 ENCOUNTER — Ambulatory Visit (HOSPITAL_COMMUNITY): Payer: Medicare Other | Attending: Cardiology | Admitting: Radiology

## 2014-08-17 DIAGNOSIS — R0789 Other chest pain: Secondary | ICD-10-CM | POA: Insufficient documentation

## 2014-08-17 DIAGNOSIS — I10 Essential (primary) hypertension: Secondary | ICD-10-CM | POA: Diagnosis not present

## 2014-08-17 DIAGNOSIS — E7800 Pure hypercholesterolemia, unspecified: Secondary | ICD-10-CM

## 2014-08-17 DIAGNOSIS — R0609 Other forms of dyspnea: Secondary | ICD-10-CM | POA: Diagnosis not present

## 2014-08-17 DIAGNOSIS — R002 Palpitations: Secondary | ICD-10-CM | POA: Insufficient documentation

## 2014-08-17 MED ORDER — TECHNETIUM TC 99M SESTAMIBI GENERIC - CARDIOLITE
30.0000 | Freq: Once | INTRAVENOUS | Status: AC | PRN
  Administered 2014-08-17: 30 via INTRAVENOUS

## 2014-08-17 MED ORDER — TECHNETIUM TC 99M SESTAMIBI GENERIC - CARDIOLITE
10.0000 | Freq: Once | INTRAVENOUS | Status: AC | PRN
  Administered 2014-08-17: 10 via INTRAVENOUS

## 2014-08-17 NOTE — Progress Notes (Signed)
[facility] [address] [place], [place] [address] [phone]    Cardiology Nuclear Med Study  Christina Knight is a 68 y.o. female     MRN : [id number]     DOB: 14-Mar-1946  Procedure Date: 08/17/2014  Nuclear Med Background Indication for Stress Test:  Evaluation for Ischemia History:  No prior known history of CAD Cardiac Risk Factors: Hypertension  Symptoms: Chest Tightness with exertion (last occurrence one month ago), Upper (L) sided back pain with/without exertion, DOE and Palpitations   Nuclear Pre-Procedure Caffeine/Decaff Intake:  None NPO After: 8:00pm   Lungs:  clear O2 Sat: 98% on room air. IV 0.9% NS with Angio Cath:  22g  IV Site: R Forearm  IV Started by:  Crissie Figures, RN  Chest Size (in):  36 Cup Size: D  Height: 5\' 5"  (1.651 m)  Weight:  139 lb (63.05 kg)  BMI:  Body mass index is 23.13 kg/(m^2). Tech Comments:  Atenolol held x 24 hrs    Nuclear Med Study 1 or 2 day study: 1 day  Stress Test Type:  Stress  Reading MD: N/A  Order Authorizing Provider:  Darlin Coco, MD  Resting Radionuclide: Technetium 9m Sestamibi  Resting Radionuclide Dose: 11.0 mCi   Stress Radionuclide:  Technetium 70m Sestamibi  Stress Radionuclide Dose: 33.0 mCi           Stress Protocol Rest HR: 81 Stress HR: 144  Rest BP: 149/92 Stress BP: 193/89  Exercise Time (min): 5:30 METS: 7.0   Predicted Max HR: 154 bpm % Max HR: 93.51 bpm Rate Pressure Product: [phone]   Dose of Adenosine (mg):  n/a Dose of Lexiscan: n/a mg  Dose of Atropine (mg): n/a Dose of Dobutamine: n/a mcg/kg/min (at max HR)  Stress Test Technologist: Irven Baltimore, RN  Nuclear Technologist:  Margie Ege     Rest Procedure:  Myocardial perfusion imaging was performed at rest 45 minutes following the intravenous administration of Technetium 53m Sestamibi. Rest ECG: NSR - Normal EKG  Stress Procedure:  The patient exercised on the treadmill utilizing the Bruce  Protocol for 5:30 minutes, RPE=16. The patient stopped due to DOE and denied any chest pain.  Technetium 109m Sestamibi was injected at peak exercise and myocardial perfusion imaging was performed after a brief delay. Stress ECG: No significant change from baseline ECG  QPS Raw Data Images: Soft tissue attenuation (diaphragm, bowel activity) underlie heart.   Stress Images:  Normal perfusion with minimal apical thinning.   Rest Images:  Comparison with the stress images reveals no significant change. Subtraction (SDS):  No evidence of ischemia. Transient Ischemic Dilatation (Normal <1.22):  1.11 Lung/Heart Ratio (Normal <0.45):  0.36  Quantitative Gated Spect Images QGS EDV:  58 ml QGS ESV:  20 ml  Impression Exercise Capacity:  Fair exercise capacity. BP Response:  Normal blood pressure response. Clinical Symptoms:  No chest pain. ECG Impression:  No significant ST segment change suggestive of ischemia. Comparison with Prior Nuclear Study: No previous nuclear study performed  Overall Impression:  Normal stress nuclear study.  Low risk scan    LV Ejection Fraction: 80%.  LV Wall Motion:  NL LV Function; NL Wall Motion   Dorris Carnes

## 2014-09-13 DIAGNOSIS — N882 Stricture and stenosis of cervix uteri: Secondary | ICD-10-CM | POA: Diagnosis not present

## 2014-09-25 DIAGNOSIS — I10 Essential (primary) hypertension: Secondary | ICD-10-CM | POA: Diagnosis not present

## 2014-09-25 DIAGNOSIS — F322 Major depressive disorder, single episode, severe without psychotic features: Secondary | ICD-10-CM | POA: Diagnosis not present

## 2014-09-25 DIAGNOSIS — E780 Pure hypercholesterolemia: Secondary | ICD-10-CM | POA: Diagnosis not present

## 2014-09-25 DIAGNOSIS — M810 Age-related osteoporosis without current pathological fracture: Secondary | ICD-10-CM | POA: Diagnosis not present

## 2014-09-25 DIAGNOSIS — K219 Gastro-esophageal reflux disease without esophagitis: Secondary | ICD-10-CM | POA: Diagnosis not present

## 2014-09-25 DIAGNOSIS — E559 Vitamin D deficiency, unspecified: Secondary | ICD-10-CM | POA: Diagnosis not present

## 2014-09-25 DIAGNOSIS — Z23 Encounter for immunization: Secondary | ICD-10-CM | POA: Diagnosis not present

## 2014-11-17 DIAGNOSIS — R062 Wheezing: Secondary | ICD-10-CM | POA: Diagnosis not present

## 2014-11-17 DIAGNOSIS — J209 Acute bronchitis, unspecified: Secondary | ICD-10-CM | POA: Diagnosis not present

## 2014-12-04 DIAGNOSIS — R1032 Left lower quadrant pain: Secondary | ICD-10-CM | POA: Diagnosis not present

## 2014-12-26 DIAGNOSIS — K5792 Diverticulitis of intestine, part unspecified, without perforation or abscess without bleeding: Secondary | ICD-10-CM | POA: Diagnosis not present

## 2015-01-09 ENCOUNTER — Other Ambulatory Visit: Payer: Self-pay | Admitting: Gastroenterology

## 2015-01-09 DIAGNOSIS — R1032 Left lower quadrant pain: Secondary | ICD-10-CM

## 2015-01-10 DIAGNOSIS — R1032 Left lower quadrant pain: Secondary | ICD-10-CM | POA: Diagnosis not present

## 2015-01-12 ENCOUNTER — Ambulatory Visit
Admission: RE | Admit: 2015-01-12 | Discharge: 2015-01-12 | Disposition: A | Discharge status: Home / Self Care | Payer: Medicare Other | Source: Ambulatory Visit | Attending: Gastroenterology | Admitting: Gastroenterology

## 2015-01-12 DIAGNOSIS — R1032 Left lower quadrant pain: Secondary | ICD-10-CM

## 2015-01-12 DIAGNOSIS — Z853 Personal history of malignant neoplasm of breast: Secondary | ICD-10-CM | POA: Diagnosis not present

## 2015-01-12 DIAGNOSIS — R110 Nausea: Secondary | ICD-10-CM | POA: Diagnosis not present

## 2015-01-12 DIAGNOSIS — K5730 Diverticulosis of large intestine without perforation or abscess without bleeding: Secondary | ICD-10-CM | POA: Diagnosis not present

## 2015-01-12 MED ORDER — IOPAMIDOL (ISOVUE-300) INJECTION 61%
100.0000 mL | Freq: Once | INTRAVENOUS | Status: AC | PRN
  Administered 2015-01-12: 100 mL via INTRAVENOUS

## 2015-01-16 ENCOUNTER — Other Ambulatory Visit: Payer: Medicare Other

## 2015-04-05 DIAGNOSIS — K529 Noninfective gastroenteritis and colitis, unspecified: Secondary | ICD-10-CM | POA: Diagnosis not present

## 2015-04-18 ENCOUNTER — Other Ambulatory Visit: Payer: Self-pay

## 2015-04-18 DIAGNOSIS — Z9012 Acquired absence of left breast and nipple: Secondary | ICD-10-CM

## 2015-04-18 DIAGNOSIS — Z1231 Encounter for screening mammogram for malignant neoplasm of breast: Secondary | ICD-10-CM

## 2015-06-19 ENCOUNTER — Ambulatory Visit: Payer: BC Managed Care – PPO

## 2015-06-28 ENCOUNTER — Ambulatory Visit
Admission: RE | Admit: 2015-06-28 | Discharge: 2015-06-28 | Disposition: A | Discharge status: Home / Self Care | Payer: Medicare Other | Source: Ambulatory Visit

## 2015-06-28 DIAGNOSIS — Z9012 Acquired absence of left breast and nipple: Secondary | ICD-10-CM

## 2015-06-28 DIAGNOSIS — Z1231 Encounter for screening mammogram for malignant neoplasm of breast: Secondary | ICD-10-CM

## 2015-07-04 DIAGNOSIS — D0512 Intraductal carcinoma in situ of left breast: Secondary | ICD-10-CM | POA: Diagnosis not present

## 2015-07-26 DIAGNOSIS — H2511 Age-related nuclear cataract, right eye: Secondary | ICD-10-CM | POA: Diagnosis not present

## 2015-09-10 DIAGNOSIS — K219 Gastro-esophageal reflux disease without esophagitis: Secondary | ICD-10-CM | POA: Diagnosis not present

## 2015-09-10 DIAGNOSIS — R140 Abdominal distension (gaseous): Secondary | ICD-10-CM | POA: Diagnosis not present

## 2015-09-10 DIAGNOSIS — K5790 Diverticulosis of intestine, part unspecified, without perforation or abscess without bleeding: Secondary | ICD-10-CM | POA: Diagnosis not present

## 2015-09-10 DIAGNOSIS — Z86010 Personal history of colonic polyps: Secondary | ICD-10-CM | POA: Diagnosis not present

## 2015-11-01 DIAGNOSIS — R140 Abdominal distension (gaseous): Secondary | ICD-10-CM | POA: Diagnosis not present

## 2015-11-01 DIAGNOSIS — Z8719 Personal history of other diseases of the digestive system: Secondary | ICD-10-CM | POA: Diagnosis not present

## 2015-11-12 DIAGNOSIS — R109 Unspecified abdominal pain: Secondary | ICD-10-CM | POA: Diagnosis not present

## 2015-11-14 ENCOUNTER — Other Ambulatory Visit: Payer: Self-pay | Admitting: Family Medicine

## 2015-11-14 DIAGNOSIS — R109 Unspecified abdominal pain: Secondary | ICD-10-CM

## 2015-11-19 ENCOUNTER — Ambulatory Visit
Admission: RE | Admit: 2015-11-19 | Discharge: 2015-11-19 | Disposition: A | Discharge status: Home / Self Care | Payer: Medicare Other | Source: Ambulatory Visit | Attending: Family Medicine | Admitting: Family Medicine

## 2015-11-19 DIAGNOSIS — R109 Unspecified abdominal pain: Secondary | ICD-10-CM

## 2015-11-19 DIAGNOSIS — K824 Cholesterolosis of gallbladder: Secondary | ICD-10-CM | POA: Diagnosis not present

## 2015-11-22 DIAGNOSIS — E7800 Pure hypercholesterolemia, unspecified: Secondary | ICD-10-CM | POA: Diagnosis not present

## 2015-11-22 DIAGNOSIS — M810 Age-related osteoporosis without current pathological fracture: Secondary | ICD-10-CM | POA: Diagnosis not present

## 2015-11-22 DIAGNOSIS — I10 Essential (primary) hypertension: Secondary | ICD-10-CM | POA: Diagnosis not present

## 2015-11-22 DIAGNOSIS — F322 Major depressive disorder, single episode, severe without psychotic features: Secondary | ICD-10-CM | POA: Diagnosis not present

## 2015-11-22 DIAGNOSIS — R109 Unspecified abdominal pain: Secondary | ICD-10-CM | POA: Diagnosis not present

## 2015-11-22 DIAGNOSIS — E559 Vitamin D deficiency, unspecified: Secondary | ICD-10-CM | POA: Diagnosis not present

## 2015-11-23 ENCOUNTER — Other Ambulatory Visit (HOSPITAL_COMMUNITY): Payer: Self-pay | Admitting: Family Medicine

## 2015-11-23 DIAGNOSIS — R109 Unspecified abdominal pain: Secondary | ICD-10-CM

## 2015-11-23 DIAGNOSIS — K824 Cholesterolosis of gallbladder: Secondary | ICD-10-CM

## 2015-11-26 ENCOUNTER — Ambulatory Visit (HOSPITAL_COMMUNITY)
Admission: RE | Admit: 2015-11-26 | Discharge: 2015-11-26 | Disposition: A | Discharge status: Home / Self Care | Payer: Medicare Other | Source: Ambulatory Visit | Attending: Family Medicine | Admitting: Family Medicine

## 2015-11-26 DIAGNOSIS — R1084 Generalized abdominal pain: Secondary | ICD-10-CM | POA: Diagnosis not present

## 2015-11-26 DIAGNOSIS — R109 Unspecified abdominal pain: Secondary | ICD-10-CM

## 2015-11-26 DIAGNOSIS — K824 Cholesterolosis of gallbladder: Secondary | ICD-10-CM

## 2015-11-26 MED ORDER — TECHNETIUM TC 99M MEBROFENIN IV KIT
5.1800 | PACK | Freq: Once | INTRAVENOUS | Status: AC | PRN
  Administered 2015-11-26: 5 via INTRAVENOUS

## 2015-12-06 DIAGNOSIS — Z90710 Acquired absence of both cervix and uterus: Secondary | ICD-10-CM | POA: Diagnosis not present

## 2015-12-06 DIAGNOSIS — Z6822 Body mass index (BMI) 22.0-22.9, adult: Secondary | ICD-10-CM | POA: Diagnosis not present

## 2015-12-06 DIAGNOSIS — Z1272 Encounter for screening for malignant neoplasm of vagina: Secondary | ICD-10-CM | POA: Diagnosis not present

## 2015-12-06 DIAGNOSIS — Z124 Encounter for screening for malignant neoplasm of cervix: Secondary | ICD-10-CM | POA: Diagnosis not present

## 2015-12-06 DIAGNOSIS — N3945 Continuous leakage: Secondary | ICD-10-CM | POA: Diagnosis not present

## 2016-03-08 ENCOUNTER — Emergency Department
Admission: EM | Admit: 2016-03-08 | Discharge: 2016-03-08 | Disposition: A | Discharge status: Home / Self Care | Payer: Medicare Other | Attending: Emergency Medicine | Admitting: Emergency Medicine

## 2016-03-08 ENCOUNTER — Emergency Department: Payer: Medicare Other

## 2016-03-08 ENCOUNTER — Encounter: Payer: Self-pay | Admitting: Emergency Medicine

## 2016-03-08 DIAGNOSIS — Z79899 Other long term (current) drug therapy: Secondary | ICD-10-CM | POA: Diagnosis not present

## 2016-03-08 DIAGNOSIS — S8011XA Contusion of right lower leg, initial encounter: Secondary | ICD-10-CM | POA: Insufficient documentation

## 2016-03-08 DIAGNOSIS — Z853 Personal history of malignant neoplasm of breast: Secondary | ICD-10-CM | POA: Diagnosis not present

## 2016-03-08 DIAGNOSIS — Z7982 Long term (current) use of aspirin: Secondary | ICD-10-CM | POA: Diagnosis not present

## 2016-03-08 DIAGNOSIS — W010XXA Fall on same level from slipping, tripping and stumbling without subsequent striking against object, initial encounter: Secondary | ICD-10-CM | POA: Insufficient documentation

## 2016-03-08 DIAGNOSIS — Z87891 Personal history of nicotine dependence: Secondary | ICD-10-CM | POA: Diagnosis not present

## 2016-03-08 DIAGNOSIS — I10 Essential (primary) hypertension: Secondary | ICD-10-CM | POA: Insufficient documentation

## 2016-03-08 DIAGNOSIS — M1990 Unspecified osteoarthritis, unspecified site: Secondary | ICD-10-CM | POA: Diagnosis not present

## 2016-03-08 DIAGNOSIS — Y929 Unspecified place or not applicable: Secondary | ICD-10-CM | POA: Diagnosis not present

## 2016-03-08 DIAGNOSIS — Y9389 Activity, other specified: Secondary | ICD-10-CM | POA: Insufficient documentation

## 2016-03-08 DIAGNOSIS — E785 Hyperlipidemia, unspecified: Secondary | ICD-10-CM | POA: Diagnosis not present

## 2016-03-08 DIAGNOSIS — M79605 Pain in left leg: Secondary | ICD-10-CM | POA: Diagnosis not present

## 2016-03-08 DIAGNOSIS — Y999 Unspecified external cause status: Secondary | ICD-10-CM | POA: Insufficient documentation

## 2016-03-08 DIAGNOSIS — M810 Age-related osteoporosis without current pathological fracture: Secondary | ICD-10-CM | POA: Insufficient documentation

## 2016-03-08 DIAGNOSIS — S8991XA Unspecified injury of right lower leg, initial encounter: Secondary | ICD-10-CM | POA: Diagnosis present

## 2016-03-08 DIAGNOSIS — R55 Syncope and collapse: Secondary | ICD-10-CM | POA: Insufficient documentation

## 2016-03-08 DIAGNOSIS — M79661 Pain in right lower leg: Secondary | ICD-10-CM | POA: Diagnosis not present

## 2016-03-08 LAB — BASIC METABOLIC PANEL
ANION GAP: 7 (ref 5–15)
BUN: 19 mg/dL (ref 6–20)
CALCIUM: 9.1 mg/dL (ref 8.9–10.3)
CHLORIDE: 104 mmol/L (ref 101–111)
CO2: 26 mmol/L (ref 22–32)
CREATININE: 0.85 mg/dL (ref 0.44–1.00)
GFR calc non Af Amer: >60 mL/min (ref >60)
Glucose, Bld: 112 mg/dL — ABNORMAL HIGH (ref 65–99)
Potassium: 4 mmol/L (ref 3.5–5.1)
SODIUM: 137 mmol/L (ref 135–145)

## 2016-03-08 LAB — CBC
HCT: 40.7 % (ref 35.0–47.0)
HEMOGLOBIN: 13.7 g/dL (ref 12.0–16.0)
MCH: 29.6 pg (ref 26.0–34.0)
MCHC: 33.7 g/dL (ref 32.0–36.0)
MCV: 87.8 fL (ref 80.0–100.0)
PLATELETS: 222 10*3/uL (ref 150–440)
RBC: 4.64 MIL/uL (ref 3.80–5.20)
RDW: 12.8 % (ref 11.5–14.5)
WBC: 10.2 10*3/uL (ref 3.6–11.0)

## 2016-03-08 NOTE — ED Notes (Signed)
Pt to rm 11 via EMS from home.  Pt reports mechanical fall, tripping over sandals, injury to right lower leg, bruising and abrasion apparent.  Pt reports after fall feeling lightheaded and diaphoretic.  Pt denies CP and SOB.  EMS report initial SBP of 80, pt given NS, last SBP 120.  Pt NAD at this time, resp equal and unlabored, skin warm and dry.

## 2016-03-08 NOTE — ED Provider Notes (Addendum)
[facility] Emergency Department Provider Note  ____________________________________________   I have reviewed the triage vital signs and the nursing notes.   HISTORY  Chief Complaint Fall and Near Syncope    HPI Christina Knight is a 70 y.o. female presents today after a non-syncopal trip. She was going up the stairs in her sandal caught on the edge of the stair. She is feeling completely in her normal state of health prior to this. She caught her sandal, tripping forward and barking her right shin against the stair. She did not fall or hit her head. Afterwards, she states the pain made her lightheaded. She feels completely back to normal at this time. She denies any closed head injury nausea vomiting chest pain shortness of breath and at this time has actually no symptoms of any variety aside from a bruise to her right pretibial region. She declines pain medication at this time. She denies any numbness or weakness.     Past Medical History  Diagnosis Date  . Breast cancer ([place])   . Diverticulitis   . Hypertension   . Hyperlipidemia   . History of blood clots   . Arthritis   . GERD (gastroesophageal reflux disease)   . Osteoporosis   . [place] treated with mastectomy and tram reconstruction 03/22/1998    Patient Active Problem List   Diagnosis Date Noted  . Chest discomfort 08/08/2014  . Hypercholesterolemia 08/08/2014  . Essential hypertension 08/08/2014  . Dyspnea on exertion 08/08/2014  . [place] treated with mastectomy and tram reconstruction 03/22/1998    Past Surgical History  Procedure Laterality Date  . Abdominal hysterectomy  1995    with bladder repair  . Mastectomy  1996    with reconstruction    Current Outpatient Rx  Name  Route  Sig  Dispense  Refill  . aspirin 81 MG tablet   Oral   Take 81 mg by mouth daily.         Marland Kitchen atenolol (TENORMIN) 25 MG tablet   Oral   Take 25 mg by mouth daily.         Marland Kitchen  buPROPion (WELLBUTRIN XL) 300 MG 24 hr tablet   Oral   Take 300 mg by mouth daily.           Marland Kitchen ELIDEL 1 % cream               . estradiol (ESTRACE) 0.1 MG/GM vaginal cream   Vaginal   Place 2 g vaginally 2 (two) times a week.           Marland Kitchen FLUoxetine (PROZAC) 10 MG capsule   Oral   Take 10 mg by mouth daily.           Marland Kitchen omeprazole (PRILOSEC) 20 MG capsule   Oral   Take 20 mg by mouth daily.         . polyethylene glycol (MIRALAX / GLYCOLAX) packet   Oral   Take 17 g by mouth daily.         . rosuvastatin (CRESTOR) 20 MG tablet   Oral   Take 20 mg by mouth daily.         . traZODone (DESYREL) 50 MG tablet   Oral   Take 50 mg by mouth as needed for sleep.         . Vitamin D, Ergocalciferol, (DRISDOL) 50000 UNITS CAPS  Allergies Tape  Family History  Problem Relation Age of Onset  . Colon cancer Father   . Breast cancer Maternal Aunt   . Breast cancer Paternal Grandmother     Social History Social History  Substance Use Topics  . Smoking status: Former Smoker    Quit date: 04/28/1978  . Smokeless tobacco: None     Comment: 30+ years ago, 1 pp Week for 1.5 years  . Alcohol Use: 0.0 oz/week    2-3 Glasses of wine per week     Comment: 1-2 GLASSES 3 TIMES A WEEK    Review of Systems Constitutional: No fever/chills Eyes: No visual changes. ENT: No sore throat. No stiff neck no neck pain Cardiovascular: Denies chest pain. Respiratory: Denies shortness of breath. Gastrointestinal:   no vomiting.  No diarrhea.  No constipation. Genitourinary: Negative for dysuria. Musculoskeletal: Negative lower extremity swelling Skin: Negative for rash. Neurological: Negative for headaches, focal weakness or numbness. 10-point ROS otherwise negative.  ____________________________________________   PHYSICAL EXAM:  VITAL SIGNS: ED Triage Vitals  Enc Vitals Group     BP 03/08/16 2022 130/74 mmHg     Pulse Rate 03/08/16 2022 64      Resp 03/08/16 2022 14     Temp 03/08/16 2022 97.8 F (36.6 C)     Temp Source 03/08/16 2022 Oral     SpO2 03/08/16 2022 99 %     Weight 03/08/16 2022 132 lb (59.875 kg)     Height 03/08/16 2022 5\' 5"  (1.651 m)     Head Cir --      Peak Flow --      Pain Score 03/08/16 2022 6     Pain Loc --      Pain Edu? --      Excl. in [place]? --     Constitutional: Alert and oriented. Well appearing and in no acute distress. Eyes: Conjunctivae are normal. PERRL. EOMI. Head: Atraumatic. Nose: No congestion/rhinnorhea. Mouth/Throat: Mucous membranes are moist.  Oropharynx non-erythematous. Neck: No stridor.   Nontender with no meningismus Cardiovascular: Normal rate, regular rhythm. Grossly normal heart sounds.  Good peripheral circulation. Respiratory: Normal respiratory effort.  No retractions. Lungs CTAB. Abdominal: Soft and nontender. No distention. No guarding no rebound Back:  There is no focal tenderness or step off there is no midline tenderness there are no lesions noted. there is no CVA tenderness Musculoskeletal: No lower extremity tenderness. No joint effusions, no DVT signs strong distal pulses no edema compartments are soft, Neurologic:  Normal speech and language. No gross focal neurologic deficits are appreciated.  Skin:  Skin is warm, dry and intact, bruise and abrasion noted to the right pretibial region with no obvious bony injury Psychiatric: Mood and affect are normal. Speech and behavior are normal.  ____________________________________________   LABS (all labs ordered are listed, but only abnormal results are displayed)  Labs Reviewed  BASIC METABOLIC PANEL  CBC   ____________________________________________  EKG  I personally interpreted any EKGs ordered by me or triage Sinus rhythm rate 69 bpm no acute ST elevation or acute ST depression nonspecific ST changes ____________________________________________  RADIOLOGY  I reviewed any imaging ordered by me or triage  that were performed during my shift and, if possible, patient and/or family made aware of any abnormal findings. ____________________________________________   PROCEDURES  Procedure(s) performed: None  Critical Care performed: None  ____________________________________________   INITIAL IMPRESSION / ASSESSMENT AND PLAN / ED COURSE  Pertinent labs & imaging results that were available  during my care of the patient were reviewed by me and considered in my medical decision making (see chart for details).  Patient with a non-syncopal event after which she became lightheaded "from the pain". At this time she is very well-appearing, she is not orthostatic she has no complaints or symptoms just denies chest pain it did do an EKG as a precaution and it is normal. This is a very clearly remembered mechanical event with no fall or closed head injury. We will x-ray her lower extremities and watch her on the monitor here at this time. I do not see any indication for further workup.  ----------------------------------------- 10:22 PM on 03/08/2016 -----------------------------------------  Patient with no complaints, able to cannulate with a normal gait, we will discharge her home. It does seem as if she had a vagal reaction after fall. ____________________________________________   FINAL CLINICAL IMPRESSION(S) / ED DIAGNOSES  Final diagnoses:  None      This chart was dictated using voice recognition software.  Despite best efforts to proofread,  errors can occur which can change meaning.     Schuyler Amor, MD 03/08/16 2039  Schuyler Amor, MD 03/08/16 2223

## 2016-03-25 DIAGNOSIS — S8011XA Contusion of right lower leg, initial encounter: Secondary | ICD-10-CM | POA: Diagnosis not present

## 2016-04-08 DIAGNOSIS — G5601 Carpal tunnel syndrome, right upper limb: Secondary | ICD-10-CM | POA: Diagnosis not present

## 2016-04-08 DIAGNOSIS — M25542 Pain in joints of left hand: Secondary | ICD-10-CM | POA: Diagnosis not present

## 2016-04-08 DIAGNOSIS — M180 Bilateral primary osteoarthritis of first carpometacarpal joints: Secondary | ICD-10-CM | POA: Diagnosis not present

## 2016-04-08 DIAGNOSIS — M25541 Pain in joints of right hand: Secondary | ICD-10-CM | POA: Diagnosis not present

## 2016-04-28 DIAGNOSIS — M545 Low back pain: Secondary | ICD-10-CM | POA: Diagnosis not present

## 2016-05-08 DIAGNOSIS — M545 Low back pain: Secondary | ICD-10-CM | POA: Diagnosis not present

## 2016-05-13 ENCOUNTER — Other Ambulatory Visit: Payer: Self-pay | Admitting: General Surgery

## 2016-05-13 DIAGNOSIS — M545 Low back pain: Secondary | ICD-10-CM | POA: Diagnosis not present

## 2016-05-13 DIAGNOSIS — Z1231 Encounter for screening mammogram for malignant neoplasm of breast: Secondary | ICD-10-CM

## 2016-05-15 DIAGNOSIS — M545 Low back pain: Secondary | ICD-10-CM | POA: Diagnosis not present

## 2016-05-19 DIAGNOSIS — M545 Low back pain: Secondary | ICD-10-CM | POA: Diagnosis not present

## 2016-05-29 DIAGNOSIS — M545 Low back pain: Secondary | ICD-10-CM | POA: Diagnosis not present

## 2016-06-02 DIAGNOSIS — M545 Low back pain: Secondary | ICD-10-CM | POA: Diagnosis not present

## 2016-06-09 DIAGNOSIS — M545 Low back pain: Secondary | ICD-10-CM | POA: Diagnosis not present

## 2016-06-18 DIAGNOSIS — J069 Acute upper respiratory infection, unspecified: Secondary | ICD-10-CM | POA: Diagnosis not present

## 2016-07-07 ENCOUNTER — Ambulatory Visit
Admission: RE | Admit: 2016-07-07 | Discharge: 2016-07-07 | Disposition: A | Discharge status: Home / Self Care | Payer: Medicare Other | Source: Ambulatory Visit | Attending: General Surgery | Admitting: General Surgery

## 2016-07-07 DIAGNOSIS — Z1231 Encounter for screening mammogram for malignant neoplasm of breast: Secondary | ICD-10-CM | POA: Diagnosis not present

## 2016-07-10 ENCOUNTER — Other Ambulatory Visit: Payer: Self-pay | Admitting: Gastroenterology

## 2016-07-10 DIAGNOSIS — R1013 Epigastric pain: Secondary | ICD-10-CM

## 2016-07-11 DIAGNOSIS — Z86000 Personal history of in-situ neoplasm of breast: Secondary | ICD-10-CM | POA: Diagnosis not present

## 2016-07-21 ENCOUNTER — Other Ambulatory Visit: Payer: Medicare Other

## 2016-07-24 ENCOUNTER — Ambulatory Visit
Admission: RE | Admit: 2016-07-24 | Discharge: 2016-07-24 | Disposition: A | Discharge status: Home / Self Care | Payer: Medicare Other | Source: Ambulatory Visit | Attending: Gastroenterology | Admitting: Gastroenterology

## 2016-07-24 DIAGNOSIS — R1013 Epigastric pain: Secondary | ICD-10-CM

## 2016-07-24 DIAGNOSIS — K824 Cholesterolosis of gallbladder: Secondary | ICD-10-CM | POA: Diagnosis not present

## 2016-08-17 DIAGNOSIS — Z23 Encounter for immunization: Secondary | ICD-10-CM | POA: Diagnosis not present

## 2016-08-18 DIAGNOSIS — R300 Dysuria: Secondary | ICD-10-CM | POA: Diagnosis not present

## 2016-08-18 DIAGNOSIS — R350 Frequency of micturition: Secondary | ICD-10-CM | POA: Diagnosis not present

## 2016-09-01 DIAGNOSIS — I781 Nevus, non-neoplastic: Secondary | ICD-10-CM | POA: Diagnosis not present

## 2016-09-01 DIAGNOSIS — L80 Vitiligo: Secondary | ICD-10-CM | POA: Diagnosis not present

## 2016-09-01 DIAGNOSIS — L821 Other seborrheic keratosis: Secondary | ICD-10-CM | POA: Diagnosis not present

## 2016-12-08 DIAGNOSIS — H2511 Age-related nuclear cataract, right eye: Secondary | ICD-10-CM | POA: Diagnosis not present

## 2016-12-09 DIAGNOSIS — N3945 Continuous leakage: Secondary | ICD-10-CM | POA: Diagnosis not present

## 2016-12-09 DIAGNOSIS — Z01419 Encounter for gynecological examination (general) (routine) without abnormal findings: Secondary | ICD-10-CM | POA: Diagnosis not present

## 2017-01-15 DIAGNOSIS — F322 Major depressive disorder, single episode, severe without psychotic features: Secondary | ICD-10-CM | POA: Diagnosis not present

## 2017-01-15 DIAGNOSIS — Z5181 Encounter for therapeutic drug level monitoring: Secondary | ICD-10-CM | POA: Diagnosis not present

## 2017-01-15 DIAGNOSIS — E7800 Pure hypercholesterolemia, unspecified: Secondary | ICD-10-CM | POA: Diagnosis not present

## 2017-01-15 DIAGNOSIS — I10 Essential (primary) hypertension: Secondary | ICD-10-CM | POA: Diagnosis not present

## 2017-01-15 DIAGNOSIS — M810 Age-related osteoporosis without current pathological fracture: Secondary | ICD-10-CM | POA: Diagnosis not present

## 2017-01-15 DIAGNOSIS — E559 Vitamin D deficiency, unspecified: Secondary | ICD-10-CM | POA: Diagnosis not present

## 2017-01-15 DIAGNOSIS — R5383 Other fatigue: Secondary | ICD-10-CM | POA: Diagnosis not present

## 2017-02-19 DIAGNOSIS — M8588 Other specified disorders of bone density and structure, other site: Secondary | ICD-10-CM | POA: Diagnosis not present

## 2017-02-19 DIAGNOSIS — M810 Age-related osteoporosis without current pathological fracture: Secondary | ICD-10-CM | POA: Diagnosis not present

## 2017-02-27 DIAGNOSIS — N951 Menopausal and female climacteric states: Secondary | ICD-10-CM | POA: Diagnosis not present

## 2017-02-27 DIAGNOSIS — N952 Postmenopausal atrophic vaginitis: Secondary | ICD-10-CM | POA: Diagnosis not present

## 2017-04-09 DIAGNOSIS — M545 Low back pain: Secondary | ICD-10-CM | POA: Diagnosis not present

## 2017-04-16 DIAGNOSIS — M5136 Other intervertebral disc degeneration, lumbar region: Secondary | ICD-10-CM | POA: Diagnosis not present

## 2017-04-16 DIAGNOSIS — S39012A Strain of muscle, fascia and tendon of lower back, initial encounter: Secondary | ICD-10-CM | POA: Diagnosis not present

## 2017-04-22 DIAGNOSIS — S39012A Strain of muscle, fascia and tendon of lower back, initial encounter: Secondary | ICD-10-CM | POA: Diagnosis not present

## 2017-04-22 DIAGNOSIS — M5136 Other intervertebral disc degeneration, lumbar region: Secondary | ICD-10-CM | POA: Diagnosis not present

## 2017-05-06 DIAGNOSIS — M50122 Cervical disc disorder at C5-C6 level with radiculopathy: Secondary | ICD-10-CM | POA: Diagnosis not present

## 2017-05-06 DIAGNOSIS — S32010A Wedge compression fracture of first lumbar vertebra, initial encounter for closed fracture: Secondary | ICD-10-CM | POA: Diagnosis not present

## 2017-05-06 DIAGNOSIS — M5011 Cervical disc disorder with radiculopathy,  high cervical region: Secondary | ICD-10-CM | POA: Diagnosis not present

## 2017-05-06 DIAGNOSIS — S22060A Wedge compression fracture of T7-T8 vertebra, initial encounter for closed fracture: Secondary | ICD-10-CM | POA: Diagnosis not present

## 2017-05-06 DIAGNOSIS — S22060D Wedge compression fracture of T7-T8 vertebra, subsequent encounter for fracture with routine healing: Secondary | ICD-10-CM | POA: Diagnosis not present

## 2017-05-06 DIAGNOSIS — M5412 Radiculopathy, cervical region: Secondary | ICD-10-CM | POA: Diagnosis not present

## 2017-05-06 DIAGNOSIS — M50123 Cervical disc disorder at C6-C7 level with radiculopathy: Secondary | ICD-10-CM | POA: Diagnosis not present

## 2017-05-06 DIAGNOSIS — Z87891 Personal history of nicotine dependence: Secondary | ICD-10-CM | POA: Diagnosis not present

## 2017-05-06 DIAGNOSIS — M50121 Cervical disc disorder at C4-C5 level with radiculopathy: Secondary | ICD-10-CM | POA: Diagnosis not present

## 2017-05-21 DIAGNOSIS — M546 Pain in thoracic spine: Secondary | ICD-10-CM | POA: Diagnosis not present

## 2017-05-21 DIAGNOSIS — M545 Low back pain: Secondary | ICD-10-CM | POA: Diagnosis not present

## 2017-05-21 DIAGNOSIS — M542 Cervicalgia: Secondary | ICD-10-CM | POA: Diagnosis not present

## 2017-05-27 DIAGNOSIS — M545 Low back pain: Secondary | ICD-10-CM | POA: Diagnosis not present

## 2017-05-27 DIAGNOSIS — M542 Cervicalgia: Secondary | ICD-10-CM | POA: Diagnosis not present

## 2017-05-27 DIAGNOSIS — M546 Pain in thoracic spine: Secondary | ICD-10-CM | POA: Diagnosis not present

## 2017-05-29 DIAGNOSIS — M546 Pain in thoracic spine: Secondary | ICD-10-CM | POA: Diagnosis not present

## 2017-05-29 DIAGNOSIS — M545 Low back pain: Secondary | ICD-10-CM | POA: Diagnosis not present

## 2017-05-29 DIAGNOSIS — M542 Cervicalgia: Secondary | ICD-10-CM | POA: Diagnosis not present

## 2017-06-05 DIAGNOSIS — M546 Pain in thoracic spine: Secondary | ICD-10-CM | POA: Diagnosis not present

## 2017-06-05 DIAGNOSIS — M545 Low back pain: Secondary | ICD-10-CM | POA: Diagnosis not present

## 2017-06-05 DIAGNOSIS — M542 Cervicalgia: Secondary | ICD-10-CM | POA: Diagnosis not present

## 2017-06-08 DIAGNOSIS — M542 Cervicalgia: Secondary | ICD-10-CM | POA: Diagnosis not present

## 2017-06-08 DIAGNOSIS — M545 Low back pain: Secondary | ICD-10-CM | POA: Diagnosis not present

## 2017-06-08 DIAGNOSIS — M546 Pain in thoracic spine: Secondary | ICD-10-CM | POA: Diagnosis not present

## 2017-06-11 DIAGNOSIS — B009 Herpesviral infection, unspecified: Secondary | ICD-10-CM | POA: Diagnosis not present

## 2017-06-11 DIAGNOSIS — L2389 Allergic contact dermatitis due to other agents: Secondary | ICD-10-CM | POA: Diagnosis not present

## 2017-06-12 ENCOUNTER — Ambulatory Visit
Admission: EM | Admit: 2017-06-12 | Discharge: 2017-06-12 | Disposition: A | Discharge status: Home / Self Care | Payer: Medicare Other | Attending: Family Medicine | Admitting: Family Medicine

## 2017-06-12 ENCOUNTER — Encounter: Payer: Self-pay | Admitting: *Deleted

## 2017-06-12 DIAGNOSIS — L02511 Cutaneous abscess of right hand: Secondary | ICD-10-CM | POA: Diagnosis not present

## 2017-06-12 MED ORDER — SULFAMETHOXAZOLE-TRIMETHOPRIM 800-160 MG PO TABS: 1.0000 | ORAL_TABLET | Freq: Two times a day (BID) | ORAL | 0 refills | Status: DC

## 2017-06-12 NOTE — ED Triage Notes (Signed)
Patient noticed a bump that has now become infected on the anterior middle finger tip right hand 5 days ago.

## 2017-06-18 DIAGNOSIS — M545 Low back pain: Secondary | ICD-10-CM | POA: Diagnosis not present

## 2017-06-18 DIAGNOSIS — M546 Pain in thoracic spine: Secondary | ICD-10-CM | POA: Diagnosis not present

## 2017-06-18 DIAGNOSIS — M542 Cervicalgia: Secondary | ICD-10-CM | POA: Diagnosis not present

## 2017-06-19 DIAGNOSIS — B009 Herpesviral infection, unspecified: Secondary | ICD-10-CM | POA: Diagnosis not present

## 2017-06-19 DIAGNOSIS — L2389 Allergic contact dermatitis due to other agents: Secondary | ICD-10-CM | POA: Diagnosis not present

## 2017-06-22 DIAGNOSIS — M545 Low back pain: Secondary | ICD-10-CM | POA: Diagnosis not present

## 2017-06-22 DIAGNOSIS — M546 Pain in thoracic spine: Secondary | ICD-10-CM | POA: Diagnosis not present

## 2017-06-22 DIAGNOSIS — M542 Cervicalgia: Secondary | ICD-10-CM | POA: Diagnosis not present

## 2017-06-22 DIAGNOSIS — M5412 Radiculopathy, cervical region: Secondary | ICD-10-CM | POA: Diagnosis not present

## 2017-06-24 ENCOUNTER — Other Ambulatory Visit: Payer: Self-pay | Admitting: General Surgery

## 2017-06-24 DIAGNOSIS — Z1231 Encounter for screening mammogram for malignant neoplasm of breast: Secondary | ICD-10-CM

## 2017-06-25 DIAGNOSIS — M545 Low back pain: Secondary | ICD-10-CM | POA: Diagnosis not present

## 2017-06-25 DIAGNOSIS — M546 Pain in thoracic spine: Secondary | ICD-10-CM | POA: Diagnosis not present

## 2017-06-25 DIAGNOSIS — M542 Cervicalgia: Secondary | ICD-10-CM | POA: Diagnosis not present

## 2017-07-02 DIAGNOSIS — M546 Pain in thoracic spine: Secondary | ICD-10-CM | POA: Diagnosis not present

## 2017-07-02 DIAGNOSIS — M545 Low back pain: Secondary | ICD-10-CM | POA: Diagnosis not present

## 2017-07-02 DIAGNOSIS — M542 Cervicalgia: Secondary | ICD-10-CM | POA: Diagnosis not present

## 2017-07-09 DIAGNOSIS — Z961 Presence of intraocular lens: Secondary | ICD-10-CM | POA: Diagnosis not present

## 2017-07-10 DIAGNOSIS — M545 Low back pain: Secondary | ICD-10-CM | POA: Diagnosis not present

## 2017-07-10 DIAGNOSIS — M542 Cervicalgia: Secondary | ICD-10-CM | POA: Diagnosis not present

## 2017-07-10 DIAGNOSIS — M546 Pain in thoracic spine: Secondary | ICD-10-CM | POA: Diagnosis not present

## 2017-07-13 DIAGNOSIS — K640 First degree hemorrhoids: Secondary | ICD-10-CM | POA: Diagnosis not present

## 2017-07-13 DIAGNOSIS — D126 Benign neoplasm of colon, unspecified: Secondary | ICD-10-CM | POA: Diagnosis not present

## 2017-07-13 DIAGNOSIS — R1013 Epigastric pain: Secondary | ICD-10-CM | POA: Diagnosis not present

## 2017-07-13 DIAGNOSIS — Z86010 Personal history of colonic polyps: Secondary | ICD-10-CM | POA: Diagnosis not present

## 2017-07-13 DIAGNOSIS — K5730 Diverticulosis of large intestine without perforation or abscess without bleeding: Secondary | ICD-10-CM | POA: Diagnosis not present

## 2017-07-13 DIAGNOSIS — R12 Heartburn: Secondary | ICD-10-CM | POA: Diagnosis not present

## 2017-07-13 DIAGNOSIS — K449 Diaphragmatic hernia without obstruction or gangrene: Secondary | ICD-10-CM | POA: Diagnosis not present

## 2017-07-13 DIAGNOSIS — K219 Gastro-esophageal reflux disease without esophagitis: Secondary | ICD-10-CM | POA: Diagnosis not present

## 2017-07-16 DIAGNOSIS — M545 Low back pain: Secondary | ICD-10-CM | POA: Diagnosis not present

## 2017-07-16 DIAGNOSIS — M546 Pain in thoracic spine: Secondary | ICD-10-CM | POA: Diagnosis not present

## 2017-07-16 DIAGNOSIS — M542 Cervicalgia: Secondary | ICD-10-CM | POA: Diagnosis not present

## 2017-07-29 DIAGNOSIS — M546 Pain in thoracic spine: Secondary | ICD-10-CM | POA: Diagnosis not present

## 2017-07-29 DIAGNOSIS — M545 Low back pain: Secondary | ICD-10-CM | POA: Diagnosis not present

## 2017-07-29 DIAGNOSIS — M542 Cervicalgia: Secondary | ICD-10-CM | POA: Diagnosis not present

## 2017-08-05 NOTE — ED Provider Notes (Signed)
[facility]    CSN: [id number] Arrival date & time: 06/12/17  1423     History   Chief Complaint Chief Complaint  Patient presents with  . Abscess    HPI Christina Knight is a 71 y.o. female.   The history is provided by the patient.  Abscess  Location:  Finger Finger abscess location:  R long finger Abscess quality: induration, painful, redness and warmth   Abscess quality: not draining, no fluctuance, no itching and not weeping   Red streaking: no   Progression:  Worsening Chronicity:  New Context: not diabetes, not immunosuppression, not injected drug use, not insect bite/sting and not skin injury   Context comment:  Unknown; unable to specify Relieved by:  Nothing Ineffective treatments:  Warm compresses Associated symptoms: no anorexia, no fatigue, no fever, no headaches, no nausea and no vomiting   Risk factors: no family hx of MRSA, no hx of MRSA and no prior abscess     Past Medical History:  Diagnosis Date  . Arthritis   . Breast cancer ([place])   . [place] treated with mastectomy and tram reconstruction 03/22/1998  . Diverticulitis   . GERD (gastroesophageal reflux disease)   . History of blood clots   . Hyperlipidemia   . Hypertension   . Osteoporosis     Patient Active Problem List   Diagnosis Date Noted  . Chest discomfort 08/08/2014  . Hypercholesterolemia 08/08/2014  . Essential hypertension 08/08/2014  . Dyspnea on exertion 08/08/2014  . [place] treated with mastectomy and tram reconstruction 03/22/1998    Past Surgical History:  Procedure Laterality Date  . ABDOMINAL HYSTERECTOMY  1995   with bladder repair  . MASTECTOMY  1996   with reconstruction    OB History    No data available       Home Medications    Prior to Admission medications   Medication Sig Start Date End Date Taking? Authorizing Provider  atenolol (TENORMIN) 25 MG tablet Take 25 mg by mouth daily.   Yes [provider]  buPROPion  (WELLBUTRIN XL) 300 MG 24 hr tablet Take 300 mg by mouth daily.     Yes [provider]  estradiol (ESTRACE) 0.1 MG/GM vaginal cream Place 2 g vaginally 2 (two) times a week.     Yes [provider]  FLUoxetine (PROZAC) 10 MG capsule Take 10 mg by mouth daily.     Yes [provider]  mometasone (ELOCON) 0.1 % cream Apply 1 application topically 2 (two) times daily.   Yes [provider]  omeprazole (PRILOSEC) 20 MG capsule Take 20 mg by mouth daily.   Yes [provider]  polyethylene glycol (MIRALAX / GLYCOLAX) packet Take 17 g by mouth daily.   Yes [provider]  rosuvastatin (CRESTOR) 20 MG tablet Take 20 mg by mouth daily.   Yes [provider]  valACYclovir (VALTREX) 500 MG tablet Take 500 mg by mouth 3 (three) times daily.   Yes [provider]  Vitamin D, Ergocalciferol, (DRISDOL) 50000 UNITS CAPS  04/19/13  Yes [provider]  aspirin 81 MG tablet Take 81 mg by mouth daily.    [provider]  ELIDEL 1 % cream  04/24/13   [provider]  sulfamethoxazole-trimethoprim (BACTRIM DS,SEPTRA DS) 800-160 MG tablet Take 1 tablet by mouth 2 (two) times daily. 06/12/17   Norval Gable, MD  traZODone (DESYREL) 50 MG tablet Take 50 mg by mouth as needed for  sleep.    [provider]    Family History Family History  Problem Relation Age of Onset  . Colon cancer Father   . Breast cancer Maternal Aunt   . Breast cancer Paternal Grandmother     Social History Social History  Substance Use Topics  . Smoking status: Former Smoker    Quit date: 04/28/1978  . Smokeless tobacco: Never Used     Comment: 30+ years ago, 1 pp Week for 1.5 years  . Alcohol use 0.0 oz/week    2 - 3 Glasses of wine per week     Comment: 1-2 GLASSES 3 TIMES A WEEK     Allergies   Tape   Review of Systems Review of Systems  Constitutional: Negative for fatigue and fever.  Gastrointestinal: Negative for  anorexia, nausea and vomiting.  Neurological: Negative for headaches.     Physical Exam Triage Vital Signs ED Triage Vitals  Enc Vitals Group     BP 06/12/17 1442 (!) 147/91     Pulse Rate 06/12/17 1442 64     Resp 06/12/17 1442 16     Temp 06/12/17 1442 98.7 F (37.1 C)     Temp Source 06/12/17 1442 Oral     SpO2 06/12/17 1442 98 %     Weight 06/12/17 1445 139 lb (63 kg)     Height 06/12/17 1445 5\' 4"  (1.626 m)     Head Circumference --      Peak Flow --      Pain Score 06/12/17 1445 7     Pain Loc --      Pain Edu? --      Excl. in [place]? --    No data found.   Updated Vital Signs BP (!) 147/91 (BP Location: Left Arm)   Pulse 64   Temp 98.7 F (37.1 C) (Oral)   Resp 16   Ht 5\' 4"  (1.626 m)   Wt 139 lb (63 kg)   SpO2 98%   BMI 23.86 kg/m   Visual Acuity Right Eye Distance:   Left Eye Distance:   Bilateral Distance:    Right Eye Near:   Left Eye Near:    Bilateral Near:     Physical Exam  Musculoskeletal:       Right hand: She exhibits tenderness (over distal tip of right middle finger with blanchable erythema and warmth; no drainage) and swelling (mild). She exhibits no bony tenderness, normal two-point discrimination and normal capillary refill. Normal sensation noted. Normal strength noted.     UC Treatments / Results  Labs (all labs ordered are listed, but only abnormal results are displayed) Labs Reviewed - No data to display  EKG  EKG Interpretation None       Radiology No results found.  Procedures Procedures (including critical care time)  Medications Ordered in UC Medications - No data to display   Initial Impression / Assessment and Plan / UC Course  I have reviewed the triage vital signs and the nursing notes.  Pertinent labs & imaging results that were available during my care of the patient were reviewed by me and considered in my medical decision making (see chart for details).       Final Clinical Impressions(s) / UC  Diagnoses   Final diagnoses:  Abscess of right middle finger    New Prescriptions Discharge Medication List as of 06/12/2017  3:22 PM    START taking these medications   Details  sulfamethoxazole-trimethoprim (BACTRIM DS,SEPTRA DS)  800-160 MG tablet Take 1 tablet by mouth 2 (two) times daily., Starting Fri 06/12/2017, Normal       1. diagnosis reviewed with patient 2. rx as per orders above; reviewed possible side effects, interactions, risks and benefits  3. Recommend supportive treatment with warm compresses to area 4. Follow-up prn if symptoms worsen or don't improve  Controlled Substance Prescriptions [place] Controlled Substance Registry consulted? Not Applicable   Norval Gable, MD 08/05/17 [phone]

## 2017-08-06 DIAGNOSIS — K640 First degree hemorrhoids: Secondary | ICD-10-CM | POA: Diagnosis not present

## 2017-08-06 DIAGNOSIS — Z86010 Personal history of colonic polyps: Secondary | ICD-10-CM | POA: Diagnosis not present

## 2017-08-06 DIAGNOSIS — K5730 Diverticulosis of large intestine without perforation or abscess without bleeding: Secondary | ICD-10-CM | POA: Diagnosis not present

## 2017-08-06 DIAGNOSIS — K449 Diaphragmatic hernia without obstruction or gangrene: Secondary | ICD-10-CM | POA: Diagnosis not present

## 2017-08-06 DIAGNOSIS — K219 Gastro-esophageal reflux disease without esophagitis: Secondary | ICD-10-CM | POA: Diagnosis not present

## 2017-08-06 DIAGNOSIS — R1013 Epigastric pain: Secondary | ICD-10-CM | POA: Diagnosis not present

## 2017-08-06 DIAGNOSIS — D126 Benign neoplasm of colon, unspecified: Secondary | ICD-10-CM | POA: Diagnosis not present

## 2017-08-06 DIAGNOSIS — R12 Heartburn: Secondary | ICD-10-CM | POA: Diagnosis not present

## 2017-08-10 DIAGNOSIS — M546 Pain in thoracic spine: Secondary | ICD-10-CM | POA: Diagnosis not present

## 2017-08-10 DIAGNOSIS — M545 Low back pain: Secondary | ICD-10-CM | POA: Diagnosis not present

## 2017-08-10 DIAGNOSIS — M542 Cervicalgia: Secondary | ICD-10-CM | POA: Diagnosis not present

## 2017-08-13 DIAGNOSIS — D126 Benign neoplasm of colon, unspecified: Secondary | ICD-10-CM | POA: Diagnosis not present

## 2017-08-17 ENCOUNTER — Ambulatory Visit: Payer: Medicare Other

## 2017-08-24 DIAGNOSIS — M5414 Radiculopathy, thoracic region: Secondary | ICD-10-CM | POA: Diagnosis not present

## 2017-08-24 DIAGNOSIS — M5412 Radiculopathy, cervical region: Secondary | ICD-10-CM | POA: Diagnosis not present

## 2017-08-24 DIAGNOSIS — S32010A Wedge compression fracture of first lumbar vertebra, initial encounter for closed fracture: Secondary | ICD-10-CM | POA: Diagnosis not present

## 2017-08-24 DIAGNOSIS — S22060D Wedge compression fracture of T7-T8 vertebra, subsequent encounter for fracture with routine healing: Secondary | ICD-10-CM | POA: Diagnosis not present

## 2017-08-25 ENCOUNTER — Ambulatory Visit
Admission: RE | Admit: 2017-08-25 | Discharge: 2017-08-25 | Disposition: A | Discharge status: Home / Self Care | Payer: Medicare Other | Source: Ambulatory Visit | Attending: General Surgery | Admitting: General Surgery

## 2017-08-25 ENCOUNTER — Encounter (INDEPENDENT_AMBULATORY_CARE_PROVIDER_SITE_OTHER): Payer: Self-pay

## 2017-08-25 DIAGNOSIS — Z1231 Encounter for screening mammogram for malignant neoplasm of breast: Secondary | ICD-10-CM

## 2017-08-26 DIAGNOSIS — M546 Pain in thoracic spine: Secondary | ICD-10-CM | POA: Diagnosis not present

## 2017-08-26 DIAGNOSIS — M545 Low back pain: Secondary | ICD-10-CM | POA: Diagnosis not present

## 2017-08-26 DIAGNOSIS — M542 Cervicalgia: Secondary | ICD-10-CM | POA: Diagnosis not present

## 2017-09-17 DIAGNOSIS — L80 Vitiligo: Secondary | ICD-10-CM | POA: Diagnosis not present

## 2017-09-17 DIAGNOSIS — K219 Gastro-esophageal reflux disease without esophagitis: Secondary | ICD-10-CM | POA: Diagnosis not present

## 2017-09-17 DIAGNOSIS — Z8781 Personal history of (healed) traumatic fracture: Secondary | ICD-10-CM | POA: Diagnosis not present

## 2017-09-17 DIAGNOSIS — M810 Age-related osteoporosis without current pathological fracture: Secondary | ICD-10-CM | POA: Diagnosis not present

## 2017-09-17 DIAGNOSIS — Z92241 Personal history of systemic steroid therapy: Secondary | ICD-10-CM | POA: Diagnosis not present

## 2017-09-28 DIAGNOSIS — M546 Pain in thoracic spine: Secondary | ICD-10-CM | POA: Diagnosis not present

## 2017-09-28 DIAGNOSIS — M545 Low back pain: Secondary | ICD-10-CM | POA: Diagnosis not present

## 2017-09-28 DIAGNOSIS — M542 Cervicalgia: Secondary | ICD-10-CM | POA: Diagnosis not present

## 2017-09-30 DIAGNOSIS — D0512 Intraductal carcinoma in situ of left breast: Secondary | ICD-10-CM | POA: Diagnosis not present

## 2018-05-17 ENCOUNTER — Encounter: Payer: Self-pay | Admitting: Emergency Medicine

## 2018-05-17 ENCOUNTER — Other Ambulatory Visit: Payer: Self-pay

## 2018-05-17 ENCOUNTER — Ambulatory Visit
Admission: EM | Admit: 2018-05-17 | Discharge: 2018-05-17 | Disposition: A | Discharge status: Home / Self Care | Payer: Medicare Other | Attending: Family Medicine | Admitting: Family Medicine

## 2018-05-17 DIAGNOSIS — R319 Hematuria, unspecified: Secondary | ICD-10-CM | POA: Insufficient documentation

## 2018-05-17 DIAGNOSIS — N390 Urinary tract infection, site not specified: Secondary | ICD-10-CM | POA: Insufficient documentation

## 2018-05-17 DIAGNOSIS — Z87891 Personal history of nicotine dependence: Secondary | ICD-10-CM | POA: Insufficient documentation

## 2018-05-17 DIAGNOSIS — I10 Essential (primary) hypertension: Secondary | ICD-10-CM | POA: Diagnosis not present

## 2018-05-17 DIAGNOSIS — Z79899 Other long term (current) drug therapy: Secondary | ICD-10-CM | POA: Diagnosis not present

## 2018-05-17 DIAGNOSIS — Z86718 Personal history of other venous thrombosis and embolism: Secondary | ICD-10-CM | POA: Insufficient documentation

## 2018-05-17 DIAGNOSIS — M810 Age-related osteoporosis without current pathological fracture: Secondary | ICD-10-CM | POA: Insufficient documentation

## 2018-05-17 DIAGNOSIS — M1990 Unspecified osteoarthritis, unspecified site: Secondary | ICD-10-CM | POA: Insufficient documentation

## 2018-05-17 DIAGNOSIS — Z853 Personal history of malignant neoplasm of breast: Secondary | ICD-10-CM | POA: Diagnosis not present

## 2018-05-17 DIAGNOSIS — K219 Gastro-esophageal reflux disease without esophagitis: Secondary | ICD-10-CM | POA: Diagnosis not present

## 2018-05-17 DIAGNOSIS — E7800 Pure hypercholesterolemia, unspecified: Secondary | ICD-10-CM | POA: Insufficient documentation

## 2018-05-17 DIAGNOSIS — E785 Hyperlipidemia, unspecified: Secondary | ICD-10-CM | POA: Diagnosis not present

## 2018-05-17 DIAGNOSIS — R350 Frequency of micturition: Secondary | ICD-10-CM | POA: Diagnosis present

## 2018-05-17 LAB — URINALYSIS, COMPLETE (UACMP) WITH MICROSCOPIC
Bilirubin Urine: NEGATIVE
Glucose, UA: NEGATIVE mg/dL
Leukocytes, UA: NEGATIVE
Nitrite: NEGATIVE
PH: 5.5 (ref 5.0–8.0)
Protein, ur: NEGATIVE mg/dL
Specific Gravity, Urine: >1.03 — ABNORMAL HIGH (ref 1.005–1.030)

## 2018-05-17 MED ORDER — CEPHALEXIN 500 MG PO CAPS: 500.0000 mg | ORAL_CAPSULE | Freq: Two times a day (BID) | ORAL | 0 refills | Status: AC

## 2018-05-17 NOTE — ED Provider Notes (Signed)
[facility] ____________________________________________  Time seen: Approximately 5:16 PM  I have reviewed the triage vital signs and the nursing notes.   HISTORY  Chief Complaint Urinary Frequency   HPI Christina Knight is a 72 y.o. female presented for evaluation of urinary frequency and urgency with discomfort present for last 2 to 3 days with some accompanying suprapubic pressure sensation.  Denies any other abdominal tenderness, back pain, fevers, vomiting or diarrhea.  States that she has had a history of intermittent urinary tract infections with similar presentation. Patient reports that she does have intermittent urinary incontinence as a risk factor. Denies recent UTI or recent antibiotic.  Reports otherwise feels well.  Denies alleviating measures.  Denies other aggravating factors.  Denies recent sickness.  Darcus Austin, MD: PCP   Past Medical History:  Diagnosis Date  . Arthritis   . Breast cancer ([place])   . [place] treated with mastectomy and tram reconstruction 03/22/1998  . Diverticulitis   . GERD (gastroesophageal reflux disease)   . History of blood clots   . Hyperlipidemia   . Hypertension   . Osteoporosis     Patient Active Problem List   Diagnosis Date Noted  . Chest discomfort 08/08/2014  . Hypercholesterolemia 08/08/2014  . Essential hypertension 08/08/2014  . Dyspnea on exertion 08/08/2014  . [place] treated with mastectomy and tram reconstruction 03/22/1998    Past Surgical History:  Procedure Laterality Date  . ABDOMINAL ADHESION SURGERY    . ABDOMINAL HYSTERECTOMY  1995   with bladder repair  . BREAST EXCISIONAL BIOPSY Right    Benign  . MASTECTOMY Left 1996   with reconstruction     No current facility-administered medications for this encounter.   Current Outpatient Medications:  .  amLODipine (NORVASC) 5 MG tablet, Take 1 tablet by mouth daily., Disp: , Rfl: 1 .  buPROPion (WELLBUTRIN XL) 300 MG 24 hr  tablet, Take 300 mg by mouth daily.  , Disp: , Rfl:  .  estradiol (ESTRACE) 0.1 MG/GM vaginal cream, Place 2 g vaginally 2 (two) times a week.  , Disp: , Rfl:  .  FLUoxetine (PROZAC) 10 MG capsule, Take 10 mg by mouth daily.  , Disp: , Rfl:  .  omeprazole (PRILOSEC) 20 MG capsule, Take 20 mg by mouth daily., Disp: , Rfl:  .  polyethylene glycol (MIRALAX / GLYCOLAX) packet, Take 17 g by mouth daily., Disp: , Rfl:  .  rosuvastatin (CRESTOR) 20 MG tablet, Take 20 mg by mouth daily., Disp: , Rfl:  .  Vitamin D, Ergocalciferol, (DRISDOL) 50000 UNITS CAPS, , Disp: , Rfl:  .  cephALEXin (KEFLEX) 500 MG capsule, Take 1 capsule (500 mg total) by mouth 2 (two) times daily for 7 days., Disp: 14 capsule, Rfl: 0  Allergies Tape  Family History  Problem Relation Age of Onset  . Heart failure Mother   . Colon cancer Father   . Breast cancer Maternal Aunt   . Breast cancer Paternal Grandmother     Social History Social History   Tobacco Use  . Smoking status: Former Smoker    Last attempt to quit: 04/28/1978    Years since quitting: 40.0  . Smokeless tobacco: Never Used  . Tobacco comment: 30+ years ago, 1 pp Week for 1.5 years  Substance Use Topics  . Alcohol use: Yes    Alcohol/week: 1.2 - 1.8 oz    Types: 2 - 3 Glasses of wine per week    Comment: 1-2 GLASSES 3  TIMES A WEEK  . Drug use: No    Review of Systems Constitutional: No fever/chills Cardiovascular: Denies chest pain. Respiratory: Denies shortness of breath. Gastrointestinal: No abdominal pain.   Musculoskeletal: Negative for back pain.   ____________________________________________   PHYSICAL EXAM:  VITAL SIGNS: ED Triage Vitals  Enc Vitals Group     BP 05/17/18 1624 (!) 149/86     Pulse Rate 05/17/18 1624 (!) 102     Resp 05/17/18 1624 16     Temp 05/17/18 1624 98.4 F (36.9 C)     Temp Source 05/17/18 1624 Oral     SpO2 05/17/18 1624 98 %     Weight 05/17/18 1623 133 lb (60.3 kg)     Height 05/17/18 1623 5'  5.5" (1.664 m)     Head Circumference --      Peak Flow --      Pain Score 05/17/18 1623 3     Pain Loc --      Pain Edu? --      Excl. in [place]? --     Constitutional: Alert and oriented. Well appearing and in no acute distress. ENT      Head: Normocephalic and atraumatic. Cardiovascular: Normal rate, regular rhythm. Grossly normal heart sounds.  Good peripheral circulation. Respiratory: Normal respiratory effort without tachypnea nor retractions. Breath sounds are clear and equal bilaterally. No wheezes, rales, rhonchi. Gastrointestinal: Soft and nontender. No CVA tenderness. Musculoskeletal:   No midline cervical, thoracic or lumbar tenderness to palpation.  Neurologic:  Normal speech and language. peech is normal. No gait instability.  Skin:  Skin is warm, dry Psychiatric: Mood and affect are normal. Speech and behavior are normal. Patient exhibits appropriate insight and judgment   ___________________________________________   LABS (all labs ordered are listed, but only abnormal results are displayed)  Labs Reviewed  URINALYSIS, COMPLETE (UACMP) WITH MICROSCOPIC - Abnormal; Notable for the following components:      Result Value   Specific Gravity, Urine >1.030 (*)    Hgb urine dipstick TRACE (*)    Ketones, ur TRACE (*)    Bacteria, UA FEW (*)    All other components within normal limits  URINE CULTURE     PROCEDURES Procedures   INITIAL IMPRESSION / ASSESSMENT AND PLAN / ED COURSE  Pertinent labs & imaging results that were available during my care of the patient were reviewed by me and considered in my medical decision making (see chart for details).  Well-appearing patient.  No acute distress.  Patient presenting with dysuria for 2 to 3 days.  Urinalysis reviewed.  Patient denies any other accompanying complaints or vaginal complaints.  Suspect UTI.  We will culture urine and empirically start her oral Keflex.  Encourage rest, fluids, supportive care.Discussed  indication, risks and benefits of medications with patient.  Discussed follow up with Primary care physician this week. Discussed follow up and return parameters including no resolution or any worsening concerns. Patient verbalized understanding and agreed to plan.   ____________________________________________   FINAL CLINICAL IMPRESSION(S) / ED DIAGNOSES  Final diagnoses:  Urinary tract infection with hematuria, site unspecified     ED Discharge Orders        Ordered    cephALEXin (KEFLEX) 500 MG capsule  2 times daily     05/17/18 1659       Note: This dictation was prepared with Dragon dictation along with smaller phrase technology. Any transcriptional errors that result from this process are unintentional.  Marylene Land, NP 05/17/18 1719

## 2018-05-17 NOTE — Discharge Instructions (Addendum)
Take medication as prescribed. Rest. Drink plenty of fluids.  ° °Follow up with your primary care physician this week as needed. Return to Urgent care for new or worsening concerns.  ° °

## 2018-05-17 NOTE — ED Triage Notes (Signed)
Patient in today c/o urinary frequency and lower abdominal pain x 3 days, worsening today.

## 2018-05-19 LAB — URINE CULTURE

## 2018-07-28 ENCOUNTER — Other Ambulatory Visit: Payer: Self-pay | Admitting: General Surgery

## 2018-07-28 DIAGNOSIS — Z1231 Encounter for screening mammogram for malignant neoplasm of breast: Secondary | ICD-10-CM

## 2018-08-31 ENCOUNTER — Ambulatory Visit
Admission: RE | Admit: 2018-08-31 | Discharge: 2018-08-31 | Disposition: A | Discharge status: Home / Self Care | Payer: Medicare Other | Source: Ambulatory Visit | Attending: General Surgery | Admitting: General Surgery

## 2018-08-31 DIAGNOSIS — Z1231 Encounter for screening mammogram for malignant neoplasm of breast: Secondary | ICD-10-CM

## 2018-09-07 ENCOUNTER — Other Ambulatory Visit: Payer: Self-pay

## 2018-09-07 ENCOUNTER — Encounter: Payer: Self-pay | Admitting: Emergency Medicine

## 2018-09-07 ENCOUNTER — Ambulatory Visit
Admission: EM | Admit: 2018-09-07 | Discharge: 2018-09-07 | Disposition: A | Discharge status: Home / Self Care | Payer: Medicare Other | Attending: Family Medicine | Admitting: Family Medicine

## 2018-09-07 DIAGNOSIS — Z79899 Other long term (current) drug therapy: Secondary | ICD-10-CM | POA: Diagnosis not present

## 2018-09-07 DIAGNOSIS — R300 Dysuria: Secondary | ICD-10-CM | POA: Diagnosis not present

## 2018-09-07 DIAGNOSIS — R350 Frequency of micturition: Secondary | ICD-10-CM | POA: Diagnosis present

## 2018-09-07 DIAGNOSIS — Z87891 Personal history of nicotine dependence: Secondary | ICD-10-CM | POA: Diagnosis not present

## 2018-09-07 LAB — URINALYSIS, COMPLETE (UACMP) WITH MICROSCOPIC
BILIRUBIN URINE: NEGATIVE
GLUCOSE, UA: NEGATIVE mg/dL
KETONES UR: NEGATIVE mg/dL
LEUKOCYTES UA: NEGATIVE
Nitrite: NEGATIVE
PH: 5.5 (ref 5.0–8.0)
PROTEIN: NEGATIVE mg/dL
Specific Gravity, Urine: 1.025 (ref 1.005–1.030)

## 2018-09-07 MED ORDER — FLUCONAZOLE 150 MG PO TABS: 150.0000 mg | ORAL_TABLET | Freq: Once | ORAL | 0 refills | Status: AC

## 2018-09-07 MED ORDER — CEPHALEXIN 500 MG PO CAPS: 500.0000 mg | ORAL_CAPSULE | Freq: Two times a day (BID) | ORAL | 0 refills | Status: AC

## 2018-09-07 NOTE — ED Triage Notes (Signed)
Patient c/o increase in urinary frequency that started 2-3 days ago. Patient denies any pain when urinating.

## 2018-09-07 NOTE — Discharge Instructions (Signed)
Take medication as prescribed. Rest. Drink plenty of fluids. Monitor as discussed.   Follow up with your primary care physician this week as needed. Return to Urgent care for new or worsening concerns.   

## 2018-09-07 NOTE — ED Provider Notes (Addendum)
[facility] ____________________________________________  Time seen: Approximately 5:46 PM  I have reviewed the triage vital signs and the nursing notes.   HISTORY  Chief Complaint Urinary Frequency  HPI PEARLA Christina Knight is a 72 y.o. female presenting for evaluation of urinary frequency for the last 2 to 3 days.  Patient denies any pain with urination or abdominal pain.  Patient did not notice some lower back pain 3 days ago, but states she feels like it was her normal back as compared to something else.  Patient further states she has baseline urinary incontinence at times, and states also in the last 2 to 3 days she has noticed some increase of incontinence from her baseline.  States she uses pantiliners for this which helps but unsure if it causes any vaginal irritation.  Denies full incontinence.  Denies bowel changes.  No fevers.  Denies vaginal itching, vaginal discharge or vaginal pain.  Concerned this could be a urinary tract infection.  Does also report recently her blood pressure medication was changed to include a diuretic.  Denies other aggravating alleviating factors.  Reports otherwise feels well denies other complaints. Denies renal insufficiency.  Darcus Austin, MD: PCP    Past Medical History:  Diagnosis Date  . Arthritis   . Breast cancer ([place])   . [place] treated with mastectomy and tram reconstruction 03/22/1998  . Diverticulitis   . GERD (gastroesophageal reflux disease)   . History of blood clots   . Hyperlipidemia   . Hypertension   . Osteoporosis     Patient Active Problem List   Diagnosis Date Noted  . Chest discomfort 08/08/2014  . Hypercholesterolemia 08/08/2014  . Essential hypertension 08/08/2014  . Dyspnea on exertion 08/08/2014  . [place] treated with mastectomy and tram reconstruction 03/22/1998    Past Surgical History:  Procedure Laterality Date  . ABDOMINAL ADHESION SURGERY    . ABDOMINAL HYSTERECTOMY  1995   with  bladder repair  . BREAST EXCISIONAL BIOPSY Right    Benign  . MASTECTOMY Left 1996   with reconstruction     No current facility-administered medications for this encounter.   Current Outpatient Medications:  .  amLODipine (NORVASC) 5 MG tablet, Take 1 tablet by mouth daily., Disp: , Rfl: 1 .  buPROPion (WELLBUTRIN XL) 300 MG 24 hr tablet, Take 300 mg by mouth daily.  , Disp: , Rfl:  .  estradiol (ESTRACE) 0.1 MG/GM vaginal cream, Place 2 g vaginally 2 (two) times a week.  , Disp: , Rfl:  .  FLUoxetine (PROZAC) 10 MG capsule, Take 10 mg by mouth daily.  , Disp: , Rfl:  .  hydrochlorothiazide (MICROZIDE) 12.5 MG capsule, Take 12.5 mg by mouth daily., Disp: , Rfl:  .  metoprolol succinate (TOPROL-XL) 50 MG 24 hr tablet, Take 50 mg by mouth daily. Take with or immediately following a meal., Disp: , Rfl:  .  omeprazole (PRILOSEC) 20 MG capsule, Take 20 mg by mouth daily., Disp: , Rfl:  .  polyethylene glycol (MIRALAX / GLYCOLAX) packet, Take 17 g by mouth daily., Disp: , Rfl:  .  rosuvastatin (CRESTOR) 20 MG tablet, Take 20 mg by mouth daily., Disp: , Rfl:  .  Vitamin D, Ergocalciferol, (DRISDOL) 50000 UNITS CAPS, , Disp: , Rfl:  .  cephALEXin (KEFLEX) 500 MG capsule, Take 1 capsule (500 mg total) by mouth 2 (two) times daily for 7 days., Disp: 14 capsule, Rfl: 0 .  fluconazole (DIFLUCAN) 150 MG tablet, Take 1 tablet (  150 mg total) by mouth once for 1 dose., Disp: 1 tablet, Rfl: 0  Allergies Tape  Family History  Problem Relation Age of Onset  . Heart failure Mother   . Colon cancer Father   . Breast cancer Maternal Aunt   . Breast cancer Paternal Grandmother     Social History Social History   Tobacco Use  . Smoking status: Former Smoker    Last attempt to quit: 04/28/1978    Years since quitting: 40.3  . Smokeless tobacco: Never Used  . Tobacco comment: 30+ years ago, 1 pp Week for 1.5 years  Substance Use Topics  . Alcohol use: Yes    Alcohol/week: 2.0 - 3.0 standard  drinks    Types: 2 - 3 Glasses of wine per week    Comment: 1-2 GLASSES 3 TIMES A WEEK  . Drug use: No    Review of Systems Constitutional: No fever Cardiovascular: Denies chest pain. Respiratory: Denies shortness of breath. Gastrointestinal: No abdominal pain.  Genitourinary: positive for dysuria. Musculoskeletal: as above.  Skin: Negative for rash.   ____________________________________________   PHYSICAL EXAM:  VITAL SIGNS: ED Triage Vitals  Enc Vitals Group     BP 09/07/18 1653 (!) 148/81     Pulse Rate 09/07/18 1653 63     Resp 09/07/18 1653 14     Temp 09/07/18 1653 97.7 F (36.5 C)     Temp Source 09/07/18 1653 Oral     SpO2 09/07/18 1653 100 %     Weight 09/07/18 1649 132 lb (59.9 kg)     Height 09/07/18 1649 5\' 5"  (1.651 m)     Head Circumference --      Peak Flow --      Pain Score 09/07/18 1649 0     Pain Loc --      Pain Edu? --      Excl. in [place]? --     Constitutional: Alert and oriented. Well appearing and in no acute distress. ENT      Head: Normocephalic and atraumatic. Cardiovascular: Normal rate, regular rhythm. Grossly normal heart sounds.  Good peripheral circulation. Respiratory: Normal respiratory effort without tachypnea nor retractions. Breath sounds are clear and equal bilaterally. No wheezes, rales, rhonchi. Gastrointestinal: Soft and nontender.No CVA tenderness. Musculoskeletal:  No midline cervical, thoracic or lumbar tenderness to palpation.  Neurologic:  Normal speech and language. Speech is normal. No gait instability.  Skin:  Skin is warm, dry  Psychiatric: Mood and affect are normal. Speech and behavior are normal. Patient exhibits appropriate insight and judgment   ___________________________________________   LABS (all labs ordered are listed, but only abnormal results are displayed)  Labs Reviewed  URINALYSIS, COMPLETE (UACMP) WITH MICROSCOPIC - Abnormal; Notable for the following components:      Result Value   Hgb urine  dipstick TRACE (*)    Bacteria, UA FEW (*)    All other components within normal limits  URINE CULTURE     PROCEDURES Procedures    INITIAL IMPRESSION / ASSESSMENT AND PLAN / ED COURSE  Pertinent labs & imaging results that were available during my care of the patient were reviewed by me and considered in my medical decision making (see chart for details).  Well-appearing patient.  No acute distress.  Urinalysis reviewed, discussed with patient not clear UTI.  No suspicious for UTI.  Also yeast noted in urine, suspect yeast vaginitis.  Will treat with Keflex and Diflucan and await urine culture.  Discussed monitoring for urine  culture report and stop antibiotic if culture is negative.  Patient states that she plans to follow-up with the uro-gynecologist, encourage this.  Encourage supportive care.Discussed indication, risks and benefits of medications with patient.  Discussed follow up with Primary care physician this week as needed. Discussed follow up and return parameters including no resolution or any worsening concerns. Patient verbalized understanding and agreed to plan.   ____________________________________________   FINAL CLINICAL IMPRESSION(S) / ED DIAGNOSES  Final diagnoses:  Dysuria     ED Discharge Orders         Ordered    cephALEXin (KEFLEX) 500 MG capsule  2 times daily     09/07/18 1740    fluconazole (DIFLUCAN) 150 MG tablet   Once     09/07/18 1740           Note: This dictation was prepared with Dragon dictation along with smaller phrase technology. Any transcriptional errors that result from this process are unintentional.         Marylene Land, NP 09/07/18 1751

## 2018-09-09 LAB — URINE CULTURE: Culture: NO GROWTH

## 2018-09-12 HISTORY — PX: ROTATOR CUFF REPAIR: SHX139

## 2018-09-13 ENCOUNTER — Ambulatory Visit: Payer: Medicare Other

## 2018-09-16 ENCOUNTER — Ambulatory Visit: Payer: Medicare Other

## 2018-12-16 ENCOUNTER — Encounter (HOSPITAL_COMMUNITY): Payer: Medicare Other

## 2018-12-16 ENCOUNTER — Other Ambulatory Visit (HOSPITAL_COMMUNITY): Payer: Self-pay

## 2018-12-17 ENCOUNTER — Ambulatory Visit (HOSPITAL_COMMUNITY)
Admission: RE | Admit: 2018-12-17 | Discharge: 2018-12-17 | Disposition: A | Discharge status: Home / Self Care | Payer: Medicare Other | Source: Ambulatory Visit | Attending: Internal Medicine | Admitting: Internal Medicine

## 2018-12-17 ENCOUNTER — Other Ambulatory Visit: Payer: Self-pay

## 2018-12-17 DIAGNOSIS — M810 Age-related osteoporosis without current pathological fracture: Secondary | ICD-10-CM | POA: Insufficient documentation

## 2018-12-17 MED ORDER — ZOLEDRONIC ACID 5 MG/100ML IV SOLN
5.0000 mg | Freq: Once | INTRAVENOUS | Status: AC
  Administered 2018-12-17: 5 mg via INTRAVENOUS

## 2018-12-17 MED ORDER — ZOLEDRONIC ACID 5 MG/100ML IV SOLN
INTRAVENOUS | Status: AC
  Filled 2018-12-17: qty 100

## 2018-12-17 NOTE — Discharge Instructions (Signed)

## 2019-04-13 ENCOUNTER — Other Ambulatory Visit: Payer: Self-pay

## 2019-04-14 ENCOUNTER — Telehealth (INDEPENDENT_AMBULATORY_CARE_PROVIDER_SITE_OTHER): Payer: Medicare Other | Admitting: Neurology

## 2019-04-14 ENCOUNTER — Encounter: Payer: Self-pay | Admitting: Neurology

## 2019-04-14 VITALS — Ht 64.0 in | Wt 136.0 lb

## 2019-04-14 DIAGNOSIS — R442 Other hallucinations: Secondary | ICD-10-CM | POA: Diagnosis not present

## 2019-04-14 NOTE — Progress Notes (Signed)
Virtual Visit via Video Note The purpose of this virtual visit is to provide medical care while limiting exposure to the novel coronavirus.    Consent was obtained for video visit:  Yes.   Answered questions that patient had about telehealth interaction:  Yes.   I discussed the limitations, risks, security and privacy concerns of performing an evaluation and management service by telemedicine. I also discussed with the patient that there may be a patient responsible charge related to this service. The patient expressed understanding and agreed to proceed.  Pt location: Home Physician Location: office Name of referring provider:  Maurice Small, MD I connected with Christina Knight at patients initiation/request on 04/14/2019 at  9:00 AM EDT by video enabled telemedicine application and verified that I am speaking with the correct person using two identifiers. Pt MRN:  [id number] Pt DOB:  05/13/1946 Video Participants:  Christina Malkin Rutkowski   History of Present Illness:  This is a pleasant 73 year old right-handed woman with a history of hypertension, hyperlipidemia, breast cancer, presenting for evaluation of "sleep disturbance/visual hallucinations, patient concerned about r/o dementia vs med side effects." She had shoulder surgery mid-December and was sleeping on her recliner due to continued shoulder issues. In January, she had been sleeping on the chair then woke up and saw something floating, like a balloon. She looked again and there was nothing there. Similar symptoms happened for the next 3 nights, one time there was something about the fan that made her look up. The weirdest one was when she thought there was someone down by her bed and she thought it was her daughter. A lot of times there are like a flash of light but can also be formed images. She went for several weeks without any symptoms until 2 weeks ago when she again woke up and saw what looked like shapes on her dresser that were  moving. It looked there was a picture and something going on in the picture. She was aware they were not real and even threw a pillow at it. They have all occurred as she is waking up around 2-4AM. She usually goes to sleep at midnight. There were no other associated symptoms such as headache, focal numbness/tingling/weakness. She had seen her eye doctor with a normal exam. She has started turning on a light at night. She was concerned that they were associated with her BP medications. She was started on HCTZ and amlodipine, then metoprolol was added. She had petechiae with amlodipine and stopped it, then after she had these episodes, she called Dr. Justin Mend and was instructed to stop metoprolol 2 weeks ago. She is now on carvedilol. She is quite anxious that these symptoms are precursors for a neurodegenerative condition. She states her memory is not as good as it used to be. She has trouble with names. She lives alone and denies getting lost driving, she independently manages finances and medications without difficulties. She has been helping care for her granddaughter for the past 6 years. Her mother had memory issues and a maternal aunt was diagnosed with Alzheimer's disease. Her daughter has night terrors with visual hallucinations. She reports a low level of depression, and anxiety has exacerbated. She is on Prozac but continues to feel this way. She has occasional sinus headaches and dizziness probably related to sinus issues per patient. No diplopia, dysarthria/dysphagia, neck pain, focal numbness/tingling/weakness, anosmia, bowel/bladder dysfunction. She has a little hand shakiness when holding her arms out, it does not affect writing  or using utensils. She has balance issues and cannot stand on one foot with eyes closed, losing her balance. She reports an episode this week where she woke up and one eye opened before the other, no ptosis.   PAST MEDICAL HISTORY: Past Medical History:  Diagnosis Date  .  Arthritis   . Breast cancer ([place])   . [place] treated with mastectomy and tram reconstruction 03/22/1998  . Diverticulitis   . GERD (gastroesophageal reflux disease)   . History of blood clots   . Hyperlipidemia   . Hypertension   . Osteoporosis     PAST SURGICAL HISTORY: Past Surgical History:  Procedure Laterality Date  . ABDOMINAL ADHESION SURGERY    . ABDOMINAL HYSTERECTOMY  1995   with bladder repair  . BREAST EXCISIONAL BIOPSY Right    Benign  . MASTECTOMY Left 1996   with reconstruction  . ROTATOR CUFF REPAIR Left 09/2018    MEDICATIONS: Current Outpatient Medications on File Prior to Visit  Medication Sig Dispense Refill  . buPROPion (WELLBUTRIN XL) 300 MG 24 hr tablet Take 300 mg by mouth daily.      . carvedilol (COREG CR) 10 MG 24 hr capsule Take 10 mg by mouth daily.    Marland Kitchen estradiol (ESTRACE) 0.1 MG/GM vaginal cream Place 2 g vaginally 2 (two) times a week.      Marland Kitchen FLUoxetine (PROZAC) 10 MG capsule Take 10 mg by mouth daily.      . hydrochlorothiazide (MICROZIDE) 12.5 MG capsule Take 12.5 mg by mouth daily.    Marland Kitchen omeprazole (PRILOSEC) 20 MG capsule Take 20 mg by mouth daily.    . polyethylene glycol (MIRALAX / GLYCOLAX) packet Take 17 g by mouth daily.    . rosuvastatin (CRESTOR) 20 MG tablet Take 20 mg by mouth daily.    . Vitamin D, Ergocalciferol, (DRISDOL) 50000 UNITS CAPS Take 50,000 Units by mouth every 7 (seven) days.      No current facility-administered medications on file prior to visit.     ALLERGIES: Allergies  Allergen Reactions  . Tape Rash  . Amlodipine Rash    FAMILY HISTORY: Family History  Problem Relation Age of Onset  . Heart failure Mother   . Colon cancer Father   . Breast cancer Maternal Aunt   . Breast cancer Paternal Grandmother     Observations/Objective:   Vitals:   04/14/19 0807  Weight: 136 lb (61.7 kg)  Height: 5\' 4"  (1.626 m)   GEN:  The patient appears stated age and is in NAD.  Neurological examination:  Patient is awake, alert, oriented x 3. No aphasia or dysarthria. Intact fluency and comprehension. Remote and recent memory intact. Able to name and repeat. Cranial nerves: Extraocular movements intact with no nystagmus. No facial asymmetry. Motor: moves all extremities symmetrically, at least anti-gravity x 4. No incoordination on finger to nose testing. Gait: narrow-based and steady, able to tandem walk adequately. Negative Romberg test.  Montreal Cognitive Assessment  04/13/2019  Visuospatial/ Executive (0/5) 5  Naming (0/3) 3  Attention: Read list of digits (0/2) 2  Attention: Read list of letters (0/1) 1  Attention: Serial 7 subtraction starting at 100 (0/3) 3  Language: Repeat phrase (0/2) 2  Language : Fluency (0/1) 1  Abstraction (0/2) 2  Delayed Recall (0/5) 4  Orientation (0/6) 6  Total 29    Assessment and Plan:   This is a pleasant 73 year old right-handed woman with a history of hypertension, hyperlipidemia, breast cancer, presenting  for evaluation of symptoms suggestive of hypnopompic hallucinations. Her neurological exam, although limited on video, is non-focal, MOCA score today normal 29/30. The patient was reassured that there are no signs of a neurodegenerative condition by history or exam today. She is quite anxious and may benefit from increasing Fluoxetine dose. Metoprolol has been reported to cause visual hallucinations, she is now off medication. We agreed to continue with current measures done such as keeping a night light on. We discussed that we will continue to monitor her symptoms and her memory, and would do brain imaging if there is a change in symptoms. She will follow-up in 6 months and knows to call for any changes.   Follow Up Instructions:   -I discussed the assessment and treatment plan with the patient. The patient was provided an opportunity to ask questions and all were answered. The patient agreed with the plan and demonstrated an understanding of the  instructions.   The patient was advised to call back or seek an in-person evaluation if the symptoms worsen or if the condition fails to improve as anticipated.     Cameron Sprang, MD

## 2019-07-29 ENCOUNTER — Other Ambulatory Visit: Payer: Self-pay | Admitting: General Surgery

## 2019-07-29 DIAGNOSIS — Z1231 Encounter for screening mammogram for malignant neoplasm of breast: Secondary | ICD-10-CM

## 2019-09-20 ENCOUNTER — Ambulatory Visit
Admission: RE | Admit: 2019-09-20 | Discharge: 2019-09-20 | Disposition: A | Discharge status: Home / Self Care | Payer: Medicare Other | Source: Ambulatory Visit | Attending: General Surgery | Admitting: General Surgery

## 2019-09-20 ENCOUNTER — Other Ambulatory Visit: Payer: Self-pay

## 2019-09-20 DIAGNOSIS — Z1231 Encounter for screening mammogram for malignant neoplasm of breast: Secondary | ICD-10-CM

## 2019-10-14 HISTORY — PX: CERVICAL FUSION: SHX112

## 2019-11-02 ENCOUNTER — Ambulatory Visit: Payer: Medicare Other | Attending: Internal Medicine

## 2019-11-02 DIAGNOSIS — Z23 Encounter for immunization: Secondary | ICD-10-CM | POA: Insufficient documentation

## 2019-11-02 NOTE — Progress Notes (Signed)
   Covid-19 Vaccination Clinic  Name:  Christina Knight    MRN: [id number] DOB: October 18, 1945  11/02/2019  [name] was observed post Covid-19 immunization for 15 minutes without incidence. She was provided with Vaccine Information Sheet and instruction to access the V-Safe system.   [name] was instructed to call 911 with any severe reactions post vaccine: Marland Kitchen Difficulty breathing  . Swelling of your face and throat  . A fast heartbeat  . A bad rash all over your body  . Dizziness and weakness    Immunizations Administered    Name Date Dose VIS Date Route   Pfizer COVID-19 Vaccine 11/02/2019  9:32 AM 0.3 mL 09/23/2019 Intramuscular   Manufacturer: [place]   Lot: [id number]   [place]: [id number]

## 2019-11-23 ENCOUNTER — Ambulatory Visit: Payer: Medicare Other | Attending: Internal Medicine

## 2019-11-23 DIAGNOSIS — Z23 Encounter for immunization: Secondary | ICD-10-CM

## 2019-11-23 NOTE — Progress Notes (Signed)
   Covid-19 Vaccination Clinic  Name:  JOSETTE LAURSEN    MRN: [id number] DOB: Jan 09, 1946  11/23/2019  Ms. Alvis was observed post Covid-19 immunization for 15 minutes without incidence. She was provided with Vaccine Information Sheet and instruction to access the V-Safe system.   Ms. Vial was instructed to call 911 with any severe reactions post vaccine: Marland Kitchen Difficulty breathing  . Swelling of your face and throat  . A fast heartbeat  . A bad rash all over your body  . Dizziness and weakness    Immunizations Administered    Name Date Dose VIS Date Route   Pfizer COVID-19 Vaccine 11/23/2019 11:47 AM 0.3 mL 09/23/2019 Intramuscular   Manufacturer: [place]   Lot: [id number]   [place]: [id number]

## 2019-11-30 ENCOUNTER — Telehealth: Payer: Medicare Other | Admitting: Neurology

## 2020-02-17 ENCOUNTER — Other Ambulatory Visit (HOSPITAL_COMMUNITY): Payer: Self-pay | Admitting: *Deleted

## 2020-02-20 ENCOUNTER — Other Ambulatory Visit: Payer: Self-pay

## 2020-02-20 ENCOUNTER — Ambulatory Visit (HOSPITAL_COMMUNITY)
Admission: RE | Admit: 2020-02-20 | Discharge: 2020-02-20 | Disposition: A | Discharge status: Home / Self Care | Payer: Medicare Other | Source: Ambulatory Visit | Attending: Internal Medicine | Admitting: Internal Medicine

## 2020-02-20 DIAGNOSIS — M810 Age-related osteoporosis without current pathological fracture: Secondary | ICD-10-CM | POA: Insufficient documentation

## 2020-02-20 MED ORDER — ZOLEDRONIC ACID 5 MG/100ML IV SOLN
INTRAVENOUS | Status: AC
  Administered 2020-02-20: 12:00:00 5 mg via INTRAVENOUS
  Filled 2020-02-20: qty 100

## 2020-02-20 MED ORDER — ZOLEDRONIC ACID 5 MG/100ML IV SOLN
5.0000 mg | Freq: Once | INTRAVENOUS | Status: AC
  Administered 2020-02-20: 5 mg via INTRAVENOUS

## 2020-02-20 NOTE — Discharge Instructions (Signed)

## 2020-07-16 ENCOUNTER — Ambulatory Visit: Payer: Medicare Other | Attending: Internal Medicine

## 2020-07-16 DIAGNOSIS — Z23 Encounter for immunization: Secondary | ICD-10-CM

## 2020-07-16 NOTE — Progress Notes (Signed)
   Covid-19 Vaccination Clinic  Name:  Christina Knight    MRN: [id number] DOB: 1946/02/07  07/16/2020  Christina Knight was observed post Covid-19 immunization for 15 minutes without incident. She was provided with Vaccine Information Sheet and instruction to access the V-Safe system.   Christina Knight was instructed to call 911 with any severe reactions post vaccine: Marland Kitchen Difficulty breathing  . Swelling of face and throat  . A fast heartbeat  . A bad rash all over body  . Dizziness and weakness

## 2020-07-30 ENCOUNTER — Other Ambulatory Visit: Payer: Self-pay | Admitting: General Surgery

## 2020-07-30 DIAGNOSIS — Z1231 Encounter for screening mammogram for malignant neoplasm of breast: Secondary | ICD-10-CM

## 2020-08-07 ENCOUNTER — Other Ambulatory Visit: Payer: Self-pay

## 2020-08-07 ENCOUNTER — Other Ambulatory Visit: Payer: Self-pay | Admitting: Internal Medicine

## 2020-08-07 ENCOUNTER — Ambulatory Visit
Admission: RE | Admit: 2020-08-07 | Discharge: 2020-08-07 | Disposition: A | Discharge status: Home / Self Care | Payer: Medicare Other | Source: Ambulatory Visit | Attending: Internal Medicine | Admitting: Internal Medicine

## 2020-08-07 ENCOUNTER — Ambulatory Visit
Admission: RE | Admit: 2020-08-07 | Discharge: 2020-08-07 | Disposition: A | Discharge status: Home / Self Care | Payer: Medicare Other | Attending: Internal Medicine | Admitting: Internal Medicine

## 2020-08-07 DIAGNOSIS — M5412 Radiculopathy, cervical region: Secondary | ICD-10-CM | POA: Diagnosis not present

## 2020-09-24 ENCOUNTER — Other Ambulatory Visit: Payer: Self-pay

## 2020-09-24 ENCOUNTER — Ambulatory Visit
Admission: RE | Admit: 2020-09-24 | Discharge: 2020-09-24 | Disposition: A | Discharge status: Home / Self Care | Payer: Medicare Other | Source: Ambulatory Visit | Attending: General Surgery | Admitting: General Surgery

## 2020-09-24 DIAGNOSIS — Z1231 Encounter for screening mammogram for malignant neoplasm of breast: Secondary | ICD-10-CM

## 2020-10-17 DIAGNOSIS — Z0000 Encounter for general adult medical examination without abnormal findings: Secondary | ICD-10-CM | POA: Diagnosis not present

## 2020-10-17 DIAGNOSIS — K219 Gastro-esophageal reflux disease without esophagitis: Secondary | ICD-10-CM | POA: Diagnosis not present

## 2020-10-17 DIAGNOSIS — F322 Major depressive disorder, single episode, severe without psychotic features: Secondary | ICD-10-CM | POA: Diagnosis not present

## 2020-10-17 DIAGNOSIS — E7800 Pure hypercholesterolemia, unspecified: Secondary | ICD-10-CM | POA: Diagnosis not present

## 2020-10-17 DIAGNOSIS — M810 Age-related osteoporosis without current pathological fracture: Secondary | ICD-10-CM | POA: Diagnosis not present

## 2020-10-17 DIAGNOSIS — I10 Essential (primary) hypertension: Secondary | ICD-10-CM | POA: Diagnosis not present

## 2020-10-19 DIAGNOSIS — Z6823 Body mass index (BMI) 23.0-23.9, adult: Secondary | ICD-10-CM | POA: Diagnosis not present

## 2020-10-19 DIAGNOSIS — J029 Acute pharyngitis, unspecified: Secondary | ICD-10-CM | POA: Diagnosis not present

## 2020-10-31 DIAGNOSIS — Z86000 Personal history of in-situ neoplasm of breast: Secondary | ICD-10-CM | POA: Diagnosis not present

## 2020-11-12 DIAGNOSIS — M5412 Radiculopathy, cervical region: Secondary | ICD-10-CM | POA: Diagnosis not present

## 2020-11-12 DIAGNOSIS — M4802 Spinal stenosis, cervical region: Secondary | ICD-10-CM | POA: Diagnosis not present

## 2020-11-12 DIAGNOSIS — R200 Anesthesia of skin: Secondary | ICD-10-CM | POA: Diagnosis not present

## 2020-11-12 DIAGNOSIS — G5602 Carpal tunnel syndrome, left upper limb: Secondary | ICD-10-CM | POA: Diagnosis not present

## 2020-11-12 DIAGNOSIS — R202 Paresthesia of skin: Secondary | ICD-10-CM | POA: Diagnosis not present

## 2020-11-20 DIAGNOSIS — R200 Anesthesia of skin: Secondary | ICD-10-CM | POA: Diagnosis not present

## 2020-11-20 DIAGNOSIS — R202 Paresthesia of skin: Secondary | ICD-10-CM | POA: Diagnosis not present

## 2020-11-20 DIAGNOSIS — M5002 Cervical disc disorder with myelopathy, mid-cervical region, unspecified level: Secondary | ICD-10-CM | POA: Diagnosis not present

## 2021-01-16 ENCOUNTER — Inpatient Hospital Stay: Payer: Medicare Other

## 2021-01-16 ENCOUNTER — Inpatient Hospital Stay: Payer: Medicare Other | Admitting: Licensed Clinical Social Worker

## 2021-01-17 DIAGNOSIS — Z20822 Contact with and (suspected) exposure to covid-19: Secondary | ICD-10-CM | POA: Diagnosis not present

## 2021-01-29 DIAGNOSIS — H2511 Age-related nuclear cataract, right eye: Secondary | ICD-10-CM | POA: Diagnosis not present

## 2021-01-30 ENCOUNTER — Inpatient Hospital Stay: Payer: Medicare Other

## 2021-01-30 ENCOUNTER — Encounter: Payer: Self-pay | Admitting: Licensed Clinical Social Worker

## 2021-01-30 ENCOUNTER — Inpatient Hospital Stay: Payer: Medicare Other | Attending: Oncology | Admitting: Licensed Clinical Social Worker

## 2021-01-30 ENCOUNTER — Other Ambulatory Visit: Payer: Self-pay

## 2021-01-30 DIAGNOSIS — Z803 Family history of malignant neoplasm of breast: Secondary | ICD-10-CM | POA: Diagnosis not present

## 2021-01-30 DIAGNOSIS — D0512 Intraductal carcinoma in situ of left breast: Secondary | ICD-10-CM

## 2021-01-30 DIAGNOSIS — Z800 Family history of malignant neoplasm of digestive organs: Secondary | ICD-10-CM | POA: Diagnosis not present

## 2021-01-30 NOTE — Progress Notes (Signed)
REFERRING PROVIDER: Jonathon Jordan, MD [address],  [place] [address]  PRIMARY PROVIDER:  Darcus Austin, MD (Inactive)  PRIMARY REASON FOR VISIT:  1. Ductal carcinoma in situ (DCIS) of left breast   2. Family history of breast cancer   3. Family history of colon cancer      HISTORY OF PRESENT ILLNESS:   Ms. Almanzar, a 75 y.o. female, was seen for a [facility] cancer genetics consultation at the request of Dr. Stephanie Acre due to a personal and family history of cancer.  Ms. Prichett presents to clinic today to discuss the possibility of a hereditary predisposition to cancer, genetic testing, and to further clarify her future cancer risks, as well as potential cancer risks for family members.   At the age of 39, Ms. Norden was diagnosed with DCIS of the left breast. The treatment plan included mastectomy. She also had genetic testing at the time for BRCA1/2 only that was reportedly negative.  CANCER HISTORY:  Oncology History   No history exists.     RISK FACTORS:  Menarche was at age 64-13.  First live birth at age 71.  Ovaries intact: yes.  Hysterectomy: no.  Menopausal status: postmenopausal.  Colonoscopy: yes; polyps, under 10. Mammogram within the last year: yes. Number of breast biopsies: 2.   Past Medical History:  Diagnosis Date  . Arthritis   . Breast cancer ([facility])   . [place] treated with mastectomy and tram reconstruction 03/22/1998  . Diverticulitis   . Family history of breast cancer   . Family history of colon cancer   . GERD (gastroesophageal reflux disease)   . History of blood clots   . Hyperlipidemia   . Hypertension   . Osteoporosis     Past Surgical History:  Procedure Laterality Date  . ABDOMINAL ADHESION SURGERY    . ABDOMINAL HYSTERECTOMY  1995   with bladder repair  . BREAST EXCISIONAL BIOPSY Right    Benign  . MASTECTOMY Left 1996   with reconstruction  . ROTATOR CUFF REPAIR Left 09/2018    Social  History   Socioeconomic History  . Marital status: Widowed    Spouse name: Not on file  . Number of children: 2  . Years of education: Not on file  . Highest education level: Not on file  Occupational History  . Not on file  Tobacco Use  . Smoking status: Former Smoker    Quit date: 04/28/1978    Years since quitting: 42.7  . Smokeless tobacco: Never Used  . Tobacco comment: 30+ years ago, 1 pp Week for 1.5 years  Vaping Use  . Vaping Use: Never used  Substance and Sexual Activity  . Alcohol use: Yes    Alcohol/week: 2.0 - 3.0 standard drinks    Types: 2 - 3 Glasses of wine per week    Comment: 1-2 GLASSES 3 TIMES A WEEK  . Drug use: No  . Sexual activity: Not on file  Other Topics Concern  . Not on file  Social History Narrative   Lives alone in one story home      Right handed      Highest level of edu- masters      Retired   Investment banker, operational of Radio broadcast assistant Strain: Not on file  Food Insecurity: Not on file  Transportation Needs: Not on file  Physical Activity: Not on file  Stress: Not on file  Social Connections: Not on file  FAMILY HISTORY:  We obtained a detailed, 4-generation family history.  Significant diagnoses are listed below: Family History  Problem Relation Age of Onset  . Heart failure Mother   . Colon cancer Father 23  . Breast cancer Sister 26       triple negative, neg. genetic testing  . Breast cancer Maternal Aunt        dx 70s, d. 48s  . Breast cancer Paternal Grandmother        dx 30s  . Breast cancer Cousin        dx 26s  . Breast cancer Cousin        in female    Ms. Viverette has 1 son, 1 daughter, no cancers. She had 2 brothers, 3 sisters. One of her sisters was recently diagnosed with triple negative breast cancer at 30 and underwent genetic testing, [facility]Next+RNA panel (patient brought copy of test with her), and this testing was negative. Her other two sisters have had genetic testing, one was negative  and the other's is still pending.  Ms. Klingel father had colon cancer at 85 and died at 64. He did not have siblings. Paternal grandmother had breast cancer in her 67s and died in her 54s. Paternal grandfather passed in his 39s.   Ms. Bolding mother died at 29 due to a heart attack. Patient had 2 maternal aunts. One had breast cancer in her 74s and died in her 32s. Patient's second cousin was recently diagnosed with leukemia and had exposures in the [employer]. Maternal grandmother died at 3, grandfather passed in his 32s. Patient's maternal grandmother's sister had a son who had breast cancer and a daughter who had breast cancer in her 42s and died in her 38s-50s.  Ms. Hubert is aware of previous family history of genetic testing for hereditary cancer risks. Patient's maternal ancestors are of [place], [place], [place] descent, and paternal ancestors are of [place], [place] descent. There is no reported Ashkenazi Jewish ancestry. There is no known consanguinity.    GENETIC COUNSELING ASSESSMENT: Ms. Burkman is a 75 y.o. female with a personal and family history of breast cancer which is somewhat suggestive of a hereditary cancer syndrome and predisposition to cancer. We, therefore, discussed and recommended the following at today's visit.   DISCUSSION: We discussed that approximately 5-10% of breast cancer is hereditary  Most cases of hereditary breast cancer are associated with BRCA1/2 genes which she reportedly had already tested negative for in the past. There are other genes associated with hereditary breast cancer as well including CHEK2, which also increases risk for colon cancer. There are other genes associated with other cancers that we can test for, cancers and risks are gene specific. We discussed that testing is beneficial for several reasons including knowing about other cancer risks, identifying potential screening and risk-reduction options that may be appropriate, and to  understand if other family members could be at risk for cancer and allow them to undergo genetic testing.   We reviewed the characteristics, features and inheritance patterns of hereditary cancer syndromes. We also discussed genetic testing, including the appropriate family members to test, the process of testing, insurance coverage and turn-around-time for results. We discussed the implications of a negative, positive and/or variant of uncertain significant result. We recommended Ms. Perezperez pursue genetic testing for the [facility] Multi-Cancer+RNA gene panel.   The Multi-Cancer Panel + RNA offered by [facility] includes sequencing and/or deletion duplication testing of the following 84 genes: AIP, ALK, APC, ATM, AXIN2,BAP1,  BARD1, BLM, BMPR1A, BRCA1, BRCA2, BRIP1, CASR, CDC73, CDH1, CDK4, CDKN1B, CDKN1C, CDKN2A (p14ARF), CDKN2A (p16INK4a), CEBPA, CHEK2, CTNNA1, DICER1, DIS3L2, EGFR (c.2369C>T, p.Thr790Met variant only), EPCAM (Deletion/duplication testing only), FH, FLCN, GATA2, GPC3, GREM1 (Promoter region deletion/duplication testing only), HOXB13 (c.251G>A, p.Gly84Glu), HRAS, KIT, MAX, MEN1, MET, MITF (c.952G>A, p.Glu318Lys variant only), MLH1, MSH2, MSH3, MSH6, MUTYH, NBN, NF1, NF2, NTHL1, PALB2, PDGFRA, PHOX2B, PMS2, POLD1, POLE, POT1, PRKAR1A, PTCH1, PTEN, RAD50, RAD51C, RAD51D, RB1, RECQL4, RET, RUNX1, SDHAF2, SDHA (sequence changes only), SDHB, SDHC, SDHD, SMAD4, SMARCA4, SMARCB1, SMARCE1, STK11, SUFU, TERC, TERT, TMEM127, TP53, TSC1, TSC2, VHL, WRN and WT1.  Based on Ms. Jarmon's personal and family history of cancer, she meets medical criteria for genetic testing. Despite that she meets criteria, she may still have an out of pocket cost. We discussed that if her out of pocket cost for testing is over $100, the laboratory will call and confirm whether she wants to proceed with testing.  If the out of pocket cost of testing is less than $100 she will be billed by the genetic testing laboratory.    PLAN: After considering the risks, benefits, and limitations, Ms. Palazzola provided informed consent to pursue genetic testing and the blood sample was sent to [facility] for analysis of the Multi-Cancer Panel +RNA. Results should be available within approximately 2-3 weeks' time, at which point they will be disclosed by telephone to Ms. Snapp, as will any additional recommendations warranted by these results. Ms. Riggenbach will receive a summary of her genetic counseling visit and a copy of her results once available. This information will also be available in Epic.   Ms. Gilmer questions were answered to her satisfaction today. Our contact information was provided should additional questions or concerns arise. Thank you for the referral and allowing [place] to share in the care of your patient.   Faith Rogue, MS, [facility] Genetic Counselor [email or web address] Phone: [phone]  The patient was seen for a total of 45 minutes in face-to-face genetic counseling.  Dr. Grayland Ormond was available for discussion regarding this case.   _______________________________________________________________________ For Office Staff:  Number of people involved in session: 1 Was an Intern/ student involved with case: no

## 2021-02-01 DIAGNOSIS — M5020 Other cervical disc displacement, unspecified cervical region: Secondary | ICD-10-CM | POA: Diagnosis not present

## 2021-02-01 DIAGNOSIS — M47812 Spondylosis without myelopathy or radiculopathy, cervical region: Secondary | ICD-10-CM | POA: Diagnosis not present

## 2021-02-11 ENCOUNTER — Encounter: Payer: Self-pay | Admitting: Licensed Clinical Social Worker

## 2021-02-11 ENCOUNTER — Ambulatory Visit: Payer: Self-pay | Admitting: Licensed Clinical Social Worker

## 2021-02-11 ENCOUNTER — Telehealth: Payer: Self-pay | Admitting: Licensed Clinical Social Worker

## 2021-02-11 DIAGNOSIS — D0512 Intraductal carcinoma in situ of left breast: Secondary | ICD-10-CM

## 2021-02-11 DIAGNOSIS — Z803 Family history of malignant neoplasm of breast: Secondary | ICD-10-CM

## 2021-02-11 DIAGNOSIS — Z800 Family history of malignant neoplasm of digestive organs: Secondary | ICD-10-CM

## 2021-02-11 DIAGNOSIS — Z1379 Encounter for other screening for genetic and chromosomal anomalies: Secondary | ICD-10-CM

## 2021-02-11 NOTE — Progress Notes (Signed)
HPI:  Christina Knight was previously seen in the [place] clinic due to a personal and family history of cancer and concerns regarding a hereditary predisposition to cancer. Please refer to our prior cancer genetics clinic note for more information regarding our discussion, assessment and recommendations, at the time. Christina Knight recent genetic test results were disclosed to her, as were recommendations warranted by these results. These results and recommendations are discussed in more detail below.  CANCER HISTORY:  Oncology History   No history exists.    FAMILY HISTORY:  We obtained a detailed, 4-generation family history.  Significant diagnoses are listed below: Family History  Problem Relation Age of Onset  . Heart failure Mother   . Colon cancer Father 51  . Breast cancer Sister 57       triple negative, neg. genetic testing  . Breast cancer Maternal Aunt        dx 16s, d. 52s  . Breast cancer Paternal Grandmother        dx 57s  . Breast cancer Cousin        dx 7s  . Breast cancer Cousin        in female    Christina Knight has 1 son, 1 daughter, no cancers. She had 2 brothers, 3 sisters. One of her sisters was recently diagnosed with triple negative breast cancer at 67 and underwent genetic testing, [facility]+RNA panel (patient brought copy of test with her), and this testing was negative. Her other two sisters have had genetic testing, one was negative and the other's is still pending.  Christina Knight father had colon cancer at 57 and died at 33. He did not have siblings. Paternal grandmother had breast cancer in her 60s and died in her 39s. Paternal grandfather passed in his 13s.   Christina Knight mother died at 68 due to a heart attack. Patient had 2 maternal aunts. One had breast cancer in her 22s and died in her 21s. Patient's second cousin was recently diagnosed with leukemia and had exposures in the [employer]. Maternal grandmother died at 81,  grandfather passed in his 35s. Patient's maternal grandmother's sister had a son who had breast cancer and a daughter who had breast cancer in her 60s and died in her 13s-50s.  Christina Knight is aware of previous family history of genetic testing for hereditary cancer risks. Patient's maternal ancestors are of [place], [place], [place] descent, and paternal ancestors are of [place], [place] descent. There is no reported Ashkenazi Jewish ancestry. There is no known consanguinity.     GENETIC TEST RESULTS: Genetic testing reported out on 02/11/2021 through the [facility] Multi- cancer panel found no pathogenic mutations.   The Multi-Cancer Panel offered by [facility] includes sequencing and/or deletion duplication testing of the following 84 genes: AIP, ALK, APC, ATM, AXIN2,BAP1,  BARD1, BLM, BMPR1A, BRCA1, BRCA2, BRIP1, CASR, CDC73, CDH1, CDK4, CDKN1B, CDKN1C, CDKN2A (p14ARF), CDKN2A (p16INK4a), CEBPA, CHEK2, CTNNA1, DICER1, DIS3L2, EGFR (c.2369C>T, p.Thr790Met variant only), EPCAM (Deletion/duplication testing only), FH, FLCN, GATA2, GPC3, GREM1 (Promoter region deletion/duplication testing only), HOXB13 (c.251G>A, p.Gly84Glu), HRAS, KIT, MAX, MEN1, MET, MITF (c.952G>A, p.Glu318Lys variant only), MLH1, MSH2, MSH3, MSH6, MUTYH, NBN, NF1, NF2, NTHL1, PALB2, PDGFRA, PHOX2B, PMS2, POLD1, POLE, POT1, PRKAR1A, PTCH1, PTEN, RAD50, RAD51C, RAD51D, RB1, RECQL4, RET, RUNX1, SDHAF2, SDHA (sequence changes only), SDHB, SDHC, SDHD, SMAD4, SMARCA4, SMARCB1, SMARCE1, STK11, SUFU, TERC, TERT, TMEM127, TP53, TSC1, TSC2, VHL, WRN and WT1.   The test report has been scanned into EPIC and is  located under the Molecular Pathology section of the Results Review tab.  A portion of the result report is included below for reference.    We discussed with Christina Knight that because current genetic testing is not perfect, it is possible there may be a gene mutation in one of these genes that current testing cannot detect, but that chance is  small.  We also discussed, that there could be another gene that has not yet been discovered, or that we have not yet tested, that is responsible for the cancer diagnoses in the family. It is also possible there is a hereditary cause for the cancer in the family that Christina Knight did not inherit and therefore was not identified in her testing.  Therefore, it is important to remain in touch with cancer genetics in the future so that we can continue to offer Christina Knight the most up to date genetic testing.   ADDITIONAL GENETIC TESTING: We discussed with Christina Knight that her genetic testing was fairly extensive.  If there are genes identified to increase cancer risk that can be analyzed in the future, we would be happy to discuss and coordinate this testing at that time.    CANCER SCREENING RECOMMENDATIONS: Christina Knight test result is considered negative (normal).  This means that we have not identified a hereditary cause for her  personal and family history of cancer at this time. Most cancers happen by chance and this negative test suggests that her cancer may fall into this category.    While reassuring, this does not definitively rule out a hereditary predisposition to cancer. It is still possible that there could be genetic mutations that are undetectable by current technology. There could be genetic mutations in genes that have not been tested or identified to increase cancer risk.  Therefore, it is recommended she continue to follow the cancer management and screening guidelines provided by her oncology and primary healthcare provider.   An individual's cancer risk and medical management are not determined by genetic test results alone. Overall cancer risk assessment incorporates additional factors, including personal medical history, family history, and any available genetic information that may result in a personalized plan for cancer prevention and surveillance.  RECOMMENDATIONS FOR FAMILY  MEMBERS:  Relatives in this family might be at some increased risk of developing cancer, over the general population risk, simply due to the family history of cancer.  We recommended female relatives in this family have a yearly mammogram beginning at age 23, or 20 years younger than the earliest onset of cancer, an annual clinical breast exam, and perform monthly breast self-exams. Female relatives in this family should also have a gynecological exam as recommended by their primary provider.  All family members should be referred for colonoscopy starting at age 70.    It is also possible there is a hereditary cause for the cancer in Christina Knight's family that she did not inherit and therefore was not identified in her.  Based on Christina Knight's family history, we recommended maternal relatives have genetic counseling and testing. Christina Knight will let us know if we can be of any assistance in coordinating genetic counseling and/or testing for these family members.  FOLLOW-UP: Lastly, we discussed with Ms. Capwell that cancer genetics is a rapidly advancing field and it is possible that new genetic tests will be appropriate for her and/or her family members in the future. We encouraged her to remain in contact with cancer genetics on an annual basis so we  can update her personal and family histories and let her know of advances in cancer genetics that may benefit this family.   Our contact number was provided. Ms. Stoffers questions were answered to her satisfaction, and she knows she is welcome to call us at anytime with additional questions or concerns.   Faith Rogue, MS, [facility] Genetic Counselor [email or web address] Phone: [phone]

## 2021-02-11 NOTE — Telephone Encounter (Signed)
Revealed negative genetic testing. This normal result is reassuring and indicates that it is unlikely Christina Knight's cancer was due to a hereditary cause.  It is unlikely that there is an increased risk of another cancer due to a mutation in one of these genes.  However, genetic testing is not perfect, and cannot definitively rule out a hereditary cause.  It will be important for her to keep in contact with genetics to learn if any additional testing may be needed in the future.

## 2021-02-20 DIAGNOSIS — Z86010 Personal history of colonic polyps: Secondary | ICD-10-CM | POA: Diagnosis not present

## 2021-02-20 DIAGNOSIS — K219 Gastro-esophageal reflux disease without esophagitis: Secondary | ICD-10-CM | POA: Diagnosis not present

## 2021-02-20 DIAGNOSIS — R109 Unspecified abdominal pain: Secondary | ICD-10-CM | POA: Diagnosis not present

## 2021-03-02 DIAGNOSIS — Z20822 Contact with and (suspected) exposure to covid-19: Secondary | ICD-10-CM | POA: Diagnosis not present

## 2021-03-05 DIAGNOSIS — Z90710 Acquired absence of both cervix and uterus: Secondary | ICD-10-CM | POA: Diagnosis not present

## 2021-03-05 DIAGNOSIS — M47819 Spondylosis without myelopathy or radiculopathy, site unspecified: Secondary | ICD-10-CM | POA: Diagnosis not present

## 2021-03-05 DIAGNOSIS — M50221 Other cervical disc displacement at C4-C5 level: Secondary | ICD-10-CM | POA: Diagnosis not present

## 2021-03-05 DIAGNOSIS — Z9012 Acquired absence of left breast and nipple: Secondary | ICD-10-CM | POA: Diagnosis not present

## 2021-03-05 DIAGNOSIS — Z86718 Personal history of other venous thrombosis and embolism: Secondary | ICD-10-CM | POA: Diagnosis not present

## 2021-03-05 DIAGNOSIS — M5001 Cervical disc disorder with myelopathy,  high cervical region: Secondary | ICD-10-CM | POA: Diagnosis not present

## 2021-03-05 DIAGNOSIS — Z20822 Contact with and (suspected) exposure to covid-19: Secondary | ICD-10-CM | POA: Diagnosis present

## 2021-03-05 DIAGNOSIS — M4712 Other spondylosis with myelopathy, cervical region: Secondary | ICD-10-CM | POA: Diagnosis not present

## 2021-03-05 DIAGNOSIS — M4802 Spinal stenosis, cervical region: Secondary | ICD-10-CM | POA: Diagnosis present

## 2021-03-05 DIAGNOSIS — I10 Essential (primary) hypertension: Secondary | ICD-10-CM | POA: Diagnosis present

## 2021-03-05 DIAGNOSIS — E7800 Pure hypercholesterolemia, unspecified: Secondary | ICD-10-CM | POA: Diagnosis present

## 2021-03-05 DIAGNOSIS — M50222 Other cervical disc displacement at C5-C6 level: Secondary | ICD-10-CM | POA: Diagnosis present

## 2021-03-05 DIAGNOSIS — M5002 Cervical disc disorder with myelopathy, mid-cervical region, unspecified level: Secondary | ICD-10-CM | POA: Diagnosis not present

## 2021-03-05 DIAGNOSIS — M4312 Spondylolisthesis, cervical region: Secondary | ICD-10-CM | POA: Diagnosis not present

## 2021-03-05 DIAGNOSIS — Z01818 Encounter for other preprocedural examination: Secondary | ICD-10-CM | POA: Diagnosis not present

## 2021-03-05 DIAGNOSIS — Z853 Personal history of malignant neoplasm of breast: Secondary | ICD-10-CM | POA: Diagnosis not present

## 2021-03-05 DIAGNOSIS — M5020 Other cervical disc displacement, unspecified cervical region: Secondary | ICD-10-CM | POA: Diagnosis not present

## 2021-03-05 DIAGNOSIS — Z87891 Personal history of nicotine dependence: Secondary | ICD-10-CM | POA: Diagnosis not present

## 2021-03-05 DIAGNOSIS — E785 Hyperlipidemia, unspecified: Secondary | ICD-10-CM | POA: Diagnosis present

## 2021-03-05 DIAGNOSIS — M810 Age-related osteoporosis without current pathological fracture: Secondary | ICD-10-CM | POA: Diagnosis present

## 2021-03-05 DIAGNOSIS — Z91048 Other nonmedicinal substance allergy status: Secondary | ICD-10-CM | POA: Diagnosis not present

## 2021-03-05 DIAGNOSIS — M4852XA Collapsed vertebra, not elsewhere classified, cervical region, initial encounter for fracture: Secondary | ICD-10-CM | POA: Diagnosis not present

## 2021-04-24 DIAGNOSIS — M47812 Spondylosis without myelopathy or radiculopathy, cervical region: Secondary | ICD-10-CM | POA: Diagnosis not present

## 2021-04-24 DIAGNOSIS — M5020 Other cervical disc displacement, unspecified cervical region: Secondary | ICD-10-CM | POA: Diagnosis not present

## 2021-05-01 DIAGNOSIS — I808 Phlebitis and thrombophlebitis of other sites: Secondary | ICD-10-CM | POA: Diagnosis not present

## 2021-05-03 DIAGNOSIS — M7918 Myalgia, other site: Secondary | ICD-10-CM | POA: Diagnosis not present

## 2021-05-03 DIAGNOSIS — M4322 Fusion of spine, cervical region: Secondary | ICD-10-CM | POA: Diagnosis not present

## 2021-05-17 DIAGNOSIS — Z20822 Contact with and (suspected) exposure to covid-19: Secondary | ICD-10-CM | POA: Diagnosis not present

## 2021-08-19 ENCOUNTER — Other Ambulatory Visit: Payer: Self-pay | Admitting: General Surgery

## 2021-08-19 DIAGNOSIS — Z1231 Encounter for screening mammogram for malignant neoplasm of breast: Secondary | ICD-10-CM

## 2021-08-30 DIAGNOSIS — Z124 Encounter for screening for malignant neoplasm of cervix: Secondary | ICD-10-CM | POA: Diagnosis not present

## 2021-08-30 DIAGNOSIS — Z6823 Body mass index (BMI) 23.0-23.9, adult: Secondary | ICD-10-CM | POA: Diagnosis not present

## 2021-09-12 DIAGNOSIS — Z6823 Body mass index (BMI) 23.0-23.9, adult: Secondary | ICD-10-CM | POA: Diagnosis not present

## 2021-09-12 DIAGNOSIS — N3281 Overactive bladder: Secondary | ICD-10-CM | POA: Diagnosis not present

## 2021-09-12 DIAGNOSIS — N393 Stress incontinence (female) (male): Secondary | ICD-10-CM | POA: Diagnosis not present

## 2021-09-26 ENCOUNTER — Other Ambulatory Visit: Payer: Self-pay | Admitting: General Surgery

## 2021-09-26 ENCOUNTER — Ambulatory Visit
Admission: RE | Admit: 2021-09-26 | Discharge: 2021-09-26 | Disposition: A | Discharge status: Home / Self Care | Payer: Medicare Other | Source: Ambulatory Visit | Attending: General Surgery | Admitting: General Surgery

## 2021-09-26 DIAGNOSIS — Z1231 Encounter for screening mammogram for malignant neoplasm of breast: Secondary | ICD-10-CM

## 2021-12-06 ENCOUNTER — Other Ambulatory Visit: Payer: Self-pay | Admitting: Internal Medicine

## 2021-12-06 DIAGNOSIS — M810 Age-related osteoporosis without current pathological fracture: Secondary | ICD-10-CM

## 2021-12-26 ENCOUNTER — Other Ambulatory Visit: Payer: Self-pay

## 2021-12-26 ENCOUNTER — Telehealth: Payer: Self-pay | Admitting: Pharmacy Technician

## 2021-12-26 DIAGNOSIS — M810 Age-related osteoporosis without current pathological fracture: Secondary | ICD-10-CM

## 2021-12-26 NOTE — Telephone Encounter (Signed)
Auth Submission: no auth needed ?Payer: medicare a/b ?Medication & CPT/J Code(s) submitted: Reclast (Zolendronic acid) J3489 ?Route of submission (phone, fax, portal): phone: ?[phone] ?Auth type: Buy/Bill ?Units/visits requested: 1 visit ?Reference number: [id number] ?Approval from: 12/26/21 to 09/27/22  ?Patient will be scheduled as soon as possbile. ?

## 2021-12-27 ENCOUNTER — Encounter: Payer: Self-pay | Admitting: Pulmonary Disease

## 2021-12-30 ENCOUNTER — Ambulatory Visit (INDEPENDENT_AMBULATORY_CARE_PROVIDER_SITE_OTHER): Payer: Medicare PPO

## 2021-12-30 ENCOUNTER — Other Ambulatory Visit: Payer: Self-pay

## 2021-12-30 ENCOUNTER — Telehealth: Payer: Self-pay | Admitting: Pharmacy Technician

## 2021-12-30 VITALS — BP 167/83 | HR 61 | Temp 97.6°F | Resp 18 | Wt 137.4 lb

## 2021-12-30 DIAGNOSIS — M810 Age-related osteoporosis without current pathological fracture: Secondary | ICD-10-CM | POA: Diagnosis not present

## 2021-12-30 MED ORDER — ACETAMINOPHEN 325 MG PO TABS
650.0000 mg | ORAL_TABLET | Freq: Once | ORAL | Status: AC
  Administered 2021-12-30: 650 mg via ORAL
  Filled 2021-12-30: qty 2

## 2021-12-30 MED ORDER — SODIUM CHLORIDE 0.9 % IV SOLN: INTRAVENOUS | Status: DC

## 2021-12-30 MED ORDER — DIPHENHYDRAMINE HCL 25 MG PO CAPS
25.0000 mg | ORAL_CAPSULE | Freq: Once | ORAL | Status: AC
  Administered 2021-12-30: 25 mg via ORAL
  Filled 2021-12-30: qty 1

## 2021-12-30 MED ORDER — ZOLEDRONIC ACID 5 MG/100ML IV SOLN
5.0000 mg | Freq: Once | INTRAVENOUS | Status: AC
  Administered 2021-12-30: 5 mg via INTRAVENOUS
  Filled 2021-12-30: qty 100

## 2021-12-30 NOTE — Telephone Encounter (Signed)
Auth Submission: no auth needed ?Payer: [employer] medicare ?Medication & CPT/J Code(s) submitted: Reclast (Zolendronic acid) J3489 ?Route of submission (phone, fax, portal): phone ?Auth type: Buy/Bill ?Units/visits requested: 1 visit ?Reference number: [id number] ?Approval from: 12/30/21 to 10/12/22  ? ?New insurance card [employer]- primary) has been scanned ?

## 2021-12-30 NOTE — Progress Notes (Signed)
Diagnosis: Osteoporosis ? ?Provider:  Marshell Garfinkel, MD ? ?Procedure: Infusion ? ?IV Type: Peripheral, IV Location: R Antecubital ? ?Reclast (Zolendronic Acid), Dose: 5 mg ? ?Infusion Start Time: 13.55 ? ?Infusion Stop Time: 14.31 ? ?Post Infusion IV Care: Observation period completed and Peripheral IV Discontinued ? ?Discharge: Condition: Good, Destination: Home . AVS provided to patient.  ? ?Performed by:  Tambra Muller, RN  ?  ?

## 2022-01-03 IMAGING — MG MM DIGITAL SCREENING UNILAT*R* W/ TOMO W/ CAD
4 series · 4 of 12 positions shown · non-contrast
Comparison: Previous exam(s).

CLINICAL DATA: Screening.

EXAM:
DIGITAL SCREENING UNILATERAL RIGHT MAMMOGRAM WITH CAD AND
TOMOSYNTHESIS
TECHNIQUE: Right screening digital craniocaudal and mediolateral oblique
mammograms were obtained. Right screening digital breast
tomosynthesis was performed. The images were evaluated with
computer-aided detection.

[R CC synth-2D]
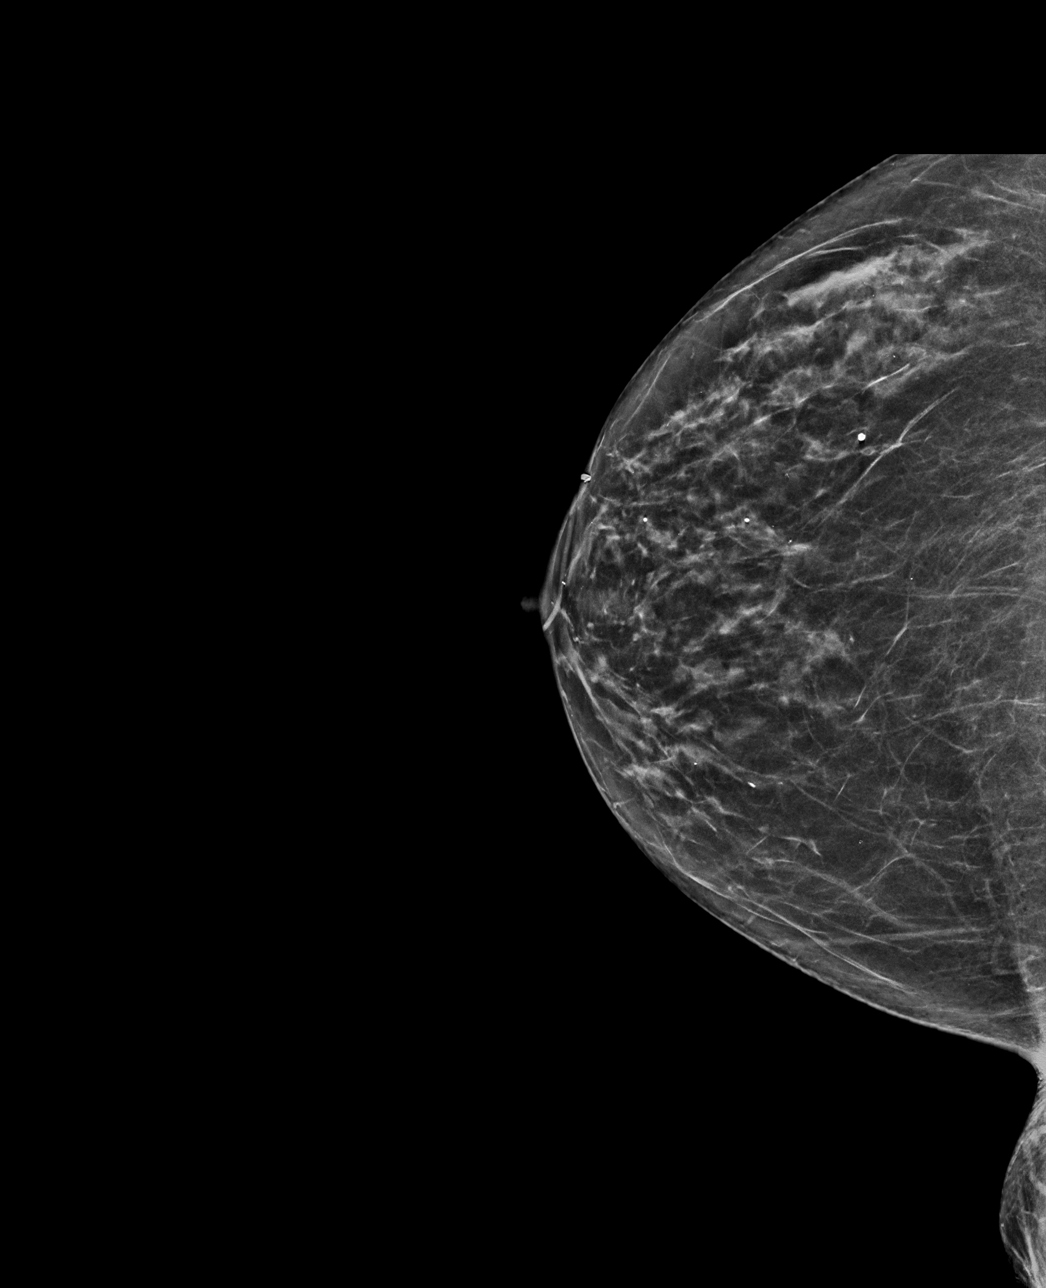

[R MLO synth-2D]
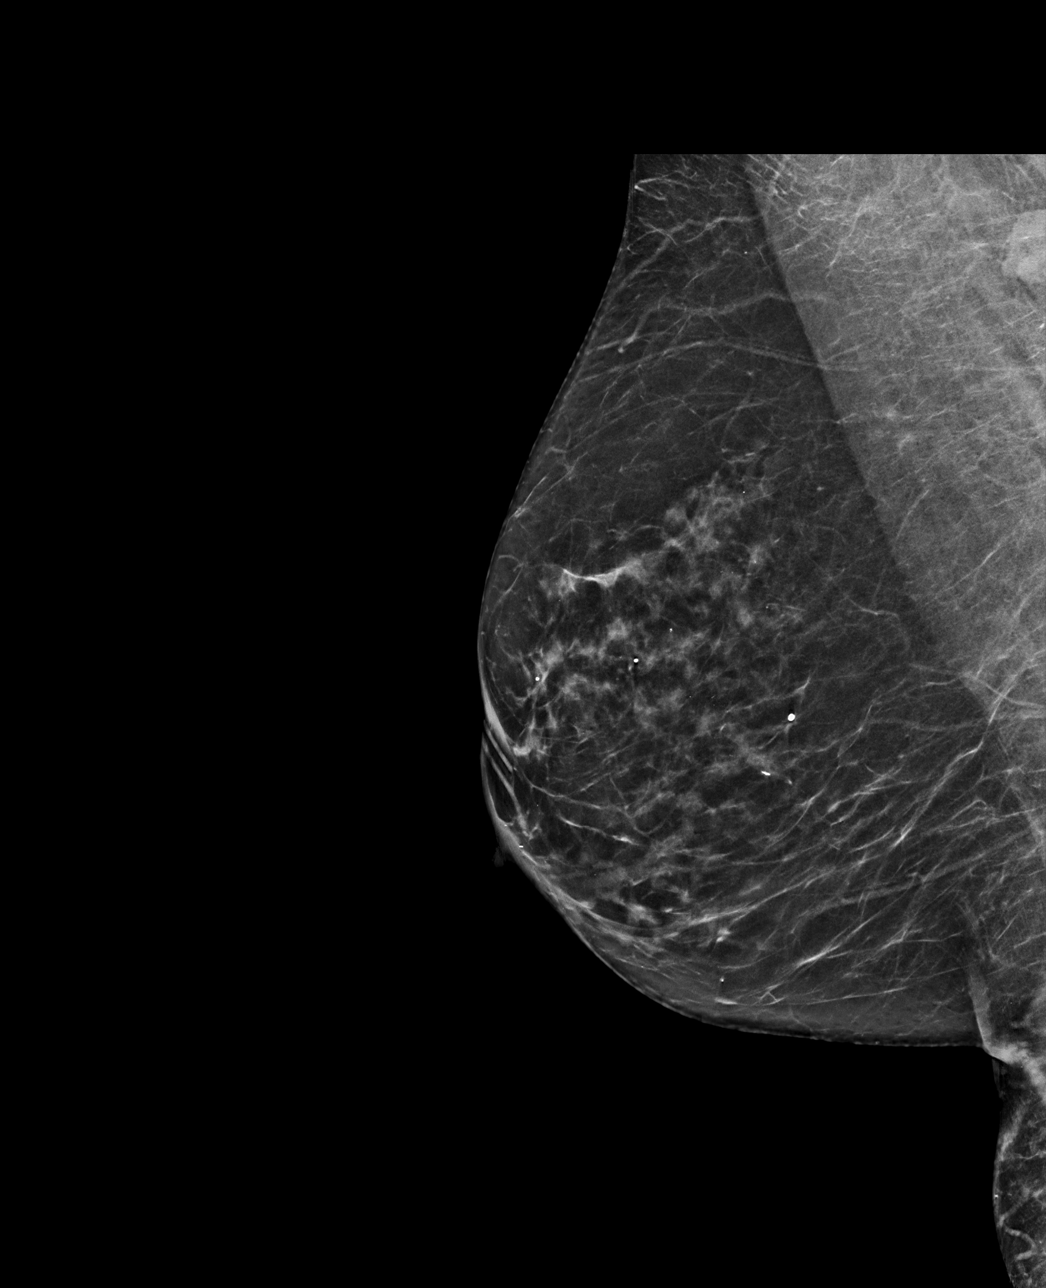

[R CC tomo · tomo slice 30/59.0]
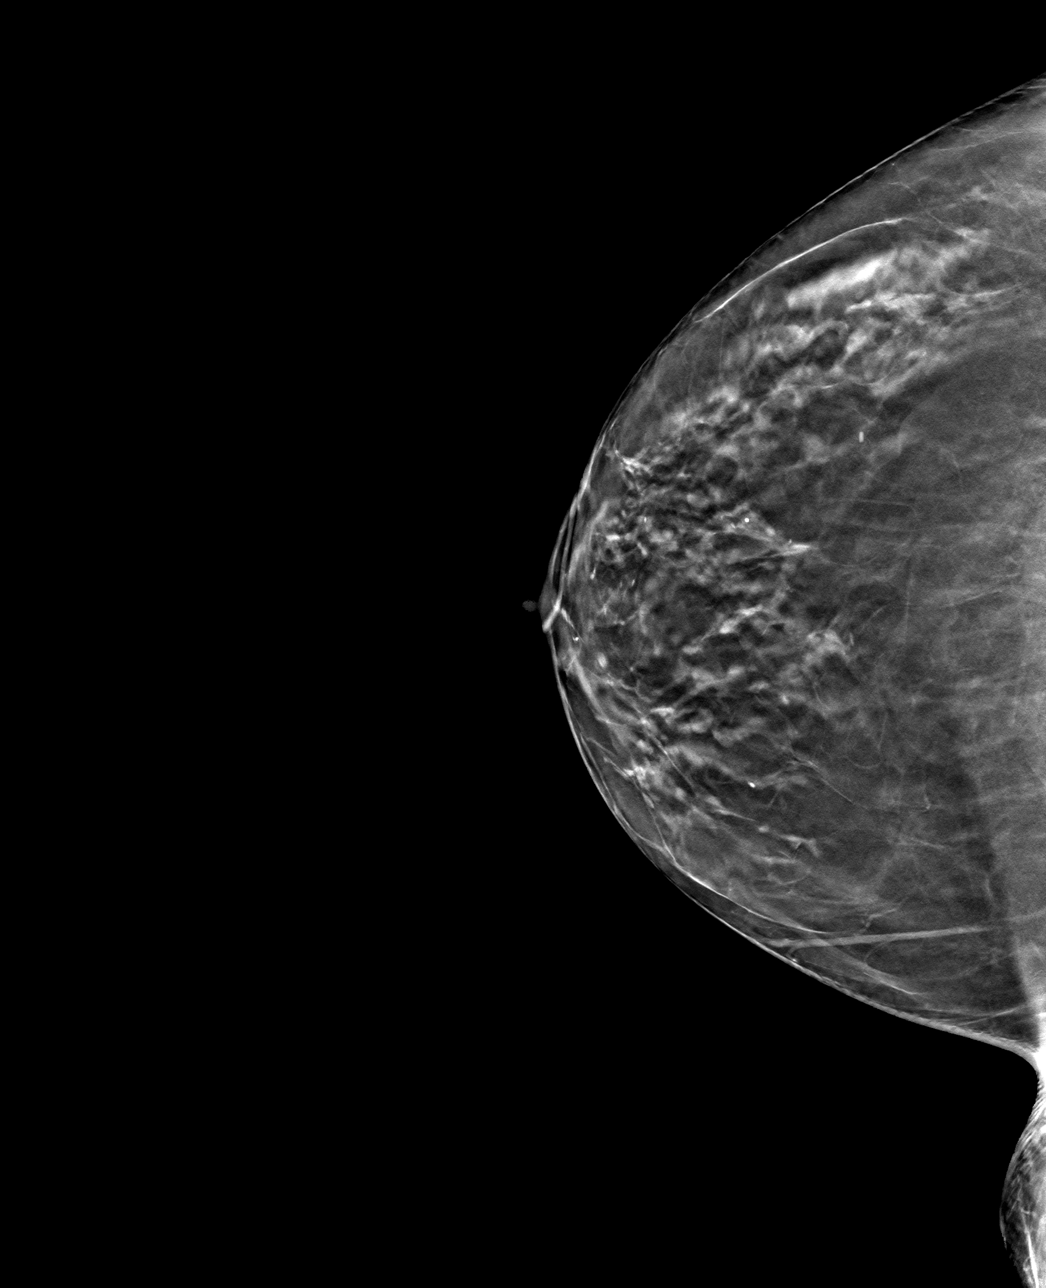

[R MLO tomo · tomo slice 33/66.0]
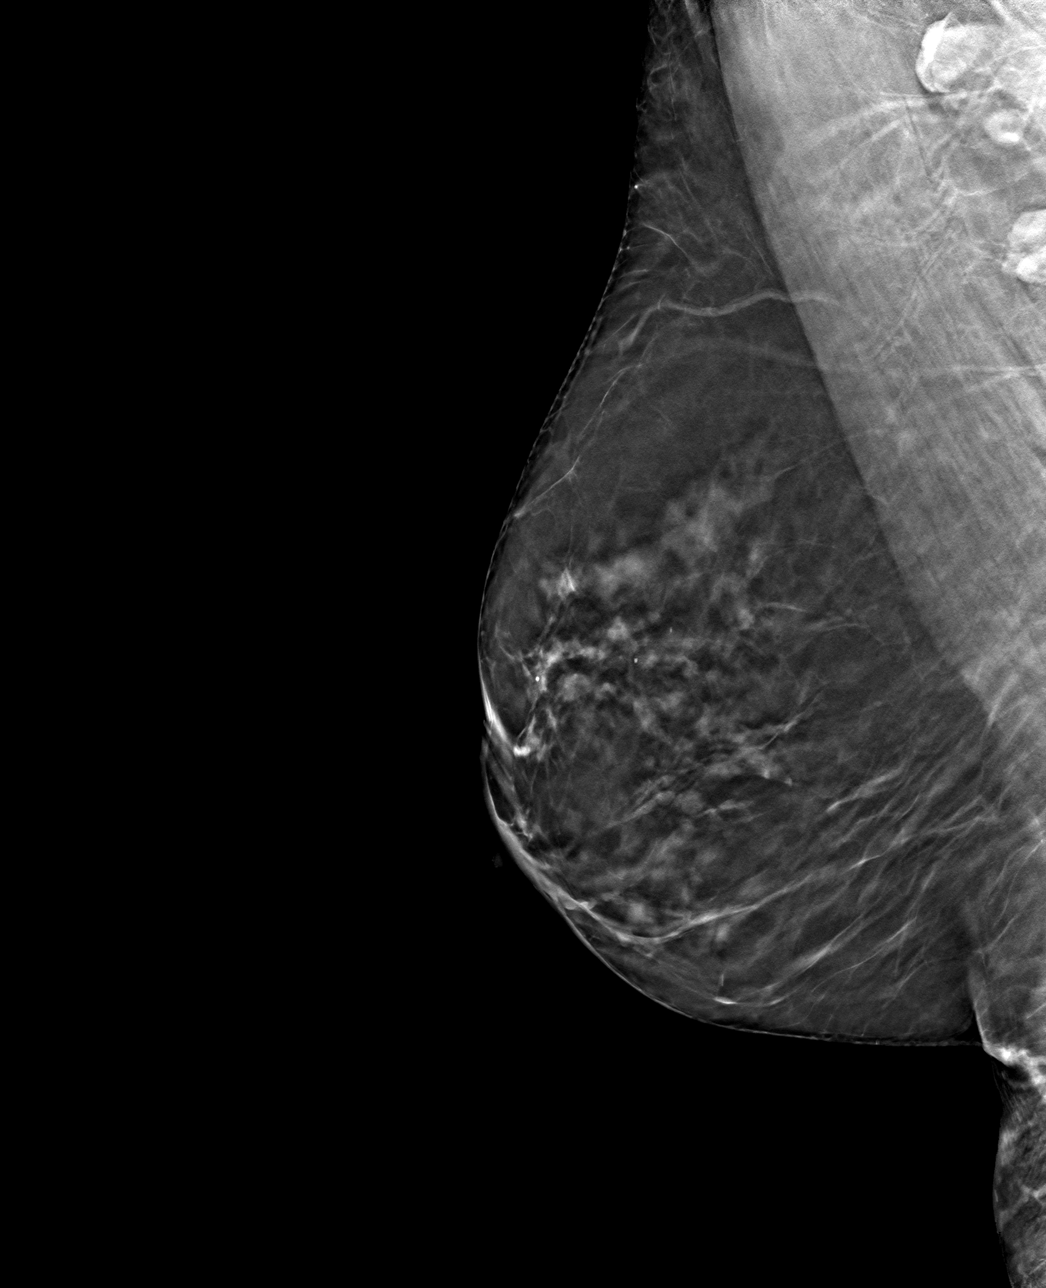

[4 of 12 positions shown; findings below may reference images not displayed]

ACR Breast Density Category c: The breast tissue is heterogeneously
dense, which may obscure small masses.
FINDINGS: There are no findings suspicious for malignancy.
IMPRESSION: No mammographic evidence of malignancy. A result letter of this
screening mammogram will be mailed directly to the patient.

RECOMMENDATION:
Screening mammogram in one year. (Code:[id number])

BI-RADS CATEGORY  1: Negative.

## 2022-01-09 DIAGNOSIS — H04302 Unspecified dacryocystitis of left lacrimal passage: Secondary | ICD-10-CM | POA: Diagnosis not present

## 2022-01-13 ENCOUNTER — Ambulatory Visit
Admission: RE | Admit: 2022-01-13 | Discharge: 2022-01-13 | Disposition: A | Discharge status: Home / Self Care | Payer: Medicare PPO | Source: Ambulatory Visit | Attending: Internal Medicine | Admitting: Internal Medicine

## 2022-01-13 DIAGNOSIS — M810 Age-related osteoporosis without current pathological fracture: Secondary | ICD-10-CM | POA: Insufficient documentation

## 2022-01-13 DIAGNOSIS — M85852 Other specified disorders of bone density and structure, left thigh: Secondary | ICD-10-CM | POA: Diagnosis not present

## 2022-01-16 DIAGNOSIS — N393 Stress incontinence (female) (male): Secondary | ICD-10-CM | POA: Diagnosis not present

## 2022-01-16 DIAGNOSIS — N3281 Overactive bladder: Secondary | ICD-10-CM | POA: Diagnosis not present

## 2022-01-27 DIAGNOSIS — L821 Other seborrheic keratosis: Secondary | ICD-10-CM | POA: Diagnosis not present

## 2022-01-27 DIAGNOSIS — L814 Other melanin hyperpigmentation: Secondary | ICD-10-CM | POA: Diagnosis not present

## 2022-01-27 DIAGNOSIS — D229 Melanocytic nevi, unspecified: Secondary | ICD-10-CM | POA: Diagnosis not present

## 2022-01-27 DIAGNOSIS — L80 Vitiligo: Secondary | ICD-10-CM | POA: Diagnosis not present

## 2022-01-27 DIAGNOSIS — L820 Inflamed seborrheic keratosis: Secondary | ICD-10-CM | POA: Diagnosis not present

## 2022-02-03 DIAGNOSIS — H43812 Vitreous degeneration, left eye: Secondary | ICD-10-CM | POA: Diagnosis not present

## 2022-02-03 DIAGNOSIS — Z0100 Encounter for examination of eyes and vision without abnormal findings: Secondary | ICD-10-CM | POA: Diagnosis not present

## 2022-02-19 DIAGNOSIS — Z7189 Other specified counseling: Secondary | ICD-10-CM | POA: Diagnosis not present

## 2022-02-21 DIAGNOSIS — N393 Stress incontinence (female) (male): Secondary | ICD-10-CM | POA: Diagnosis not present

## 2022-03-13 ENCOUNTER — Other Ambulatory Visit: Payer: Self-pay

## 2022-03-13 ENCOUNTER — Encounter: Payer: Self-pay | Admitting: Emergency Medicine

## 2022-03-13 ENCOUNTER — Ambulatory Visit
Admission: EM | Admit: 2022-03-13 | Discharge: 2022-03-13 | Disposition: A | Discharge status: Home / Self Care | Payer: TRICARE For Life (TFL)

## 2022-03-13 DIAGNOSIS — J069 Acute upper respiratory infection, unspecified: Secondary | ICD-10-CM | POA: Diagnosis not present

## 2022-03-13 MED ORDER — PROMETHAZINE-DM 6.25-15 MG/5ML PO SYRP: 5.0000 mL | ORAL_SOLUTION | Freq: Four times a day (QID) | ORAL | 0 refills | Status: AC | PRN

## 2022-03-13 NOTE — ED Triage Notes (Signed)
Patient was started to feel under the weather on memorial day.  Has test 2 times for covid and it has been negative both times.  Nasal congestion, drainage, cough and hoarseness.    Took ibuprofen last night and nasacort.

## 2022-03-13 NOTE — Discharge Instructions (Signed)
URI/COLD SYMPTOMS: Your exam today is consistent with a viral illness. Antibiotics are not indicated at this time. Use medications as directed, including cough syrup, nasal saline, and decongestants. Your symptoms should improve over the next few days and resolve within 7-10 days. Increase rest and fluids. F/u if symptoms worsen or predominate such as sore throat, ear pain, productive cough, shortness of breath, or if you develop high fevers or worsening fatigue over the next several days.    

## 2022-03-13 NOTE — ED Provider Notes (Signed)
[facility]    CSN: [id number] Arrival date & time: 03/13/22  1458      History   Chief Complaint Chief Complaint  Patient presents with   Cough    HPI Christina CASALE is a 76 y.o. female presenting for 3-day history of nasal congestion, sinus pressure and cough.  Also reports postnasal drainage and voice hoarseness.  No associated fever.  She has been fatigued.  Denies body aches, sore throat, chest pain or breathing occultly.  No nausea/vomiting or diarrhea.  Denies any sick contacts in the past week.  Has taken 2 COVID tests with the first test being the day after symptom onset and the second test being 2 days later.  Both were negative.  Has been taking Tylenol and using Nasacort nasal spray.  No other complaints today.  HPI  Past Medical History:  Diagnosis Date   Arthritis    Breast cancer ([place])    [place] treated with mastectomy and tram reconstruction 03/22/1998   Diverticulitis    Family history of breast cancer    Family history of colon cancer    GERD (gastroesophageal reflux disease)    History of blood clots    Hyperlipidemia    Hypertension    Osteoporosis     Patient Active Problem List   Diagnosis Date Noted   Osteoporosis 12/26/2021   OP (osteoporosis) 12/26/2021   Genetic testing 02/11/2021   Family history of breast cancer    Family history of colon cancer    Chest discomfort 08/08/2014   Hypercholesterolemia 08/08/2014   Essential hypertension 08/08/2014   Dyspnea on exertion 08/08/2014   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      Home Medications    Prior to Admission  medications   Medication Sig Start Date End Date Taking? Authorizing Provider  promethazine-dextromethorphan (PROMETHAZINE-DM) 6.25-15 MG/5ML syrup Take 5 mLs by mouth 4 (four) times daily as needed. 03/13/22  Yes Laurene Footman B, PA-C  valsartan (DIOVAN) 40 MG tablet Take 40 mg by mouth daily.   Yes [provider]  buPROPion (WELLBUTRIN XL) 300 MG 24 hr tablet Take 300 mg by mouth daily.      [provider]  carvedilol (COREG CR) 10 MG 24 hr capsule Take 10 mg by mouth daily.    [provider]  estradiol (ESTRACE) 0.1 MG/GM vaginal cream Place 2 g vaginally 2 (two) times a week.      [provider]  FLUoxetine (PROZAC) 10 MG capsule Take 10 mg by mouth daily.      [provider]  hydrochlorothiazide (MICROZIDE) 12.5 MG capsule Take 12.5 mg by mouth daily.    [provider]  omeprazole (PRILOSEC) 20 MG capsule Take 20 mg by mouth daily.    [provider]  polyethylene glycol (MIRALAX / GLYCOLAX) packet Take 17 g by mouth daily.    [provider]  rosuvastatin (CRESTOR) 20 MG tablet Take 20 mg by mouth daily.    [provider]  Vitamin D, Ergocalciferol, (DRISDOL) 50000 UNITS CAPS Take 50,000 Units by mouth every  7 (seven) days.  04/19/13   [provider]    Family History Family History  Problem Relation Age of Onset   Heart failure Mother    Colon cancer Father 50   Breast cancer Sister 32       triple negative, neg. genetic testing   Breast cancer Maternal Aunt        dx 68s, d. 24s   Breast cancer Paternal Grandmother        dx 3s   Breast cancer Cousin        dx 63s   Breast cancer Cousin        in female     Social History Social History   Tobacco Use   Smoking status: Former    Types: Cigarettes    Quit date: 04/28/1978    Years since quitting: 43.9   Smokeless tobacco: Never   Tobacco comments:    30+ years ago, 1 pp Week for 1.5 years  Vaping Use   Vaping Use: Never used   Substance Use Topics   Alcohol use: Yes    Alcohol/week: 2.0 - 3.0 standard drinks    Types: 2 - 3 Glasses of wine per week    Comment: 1-2 GLASSES 3 TIMES A WEEK   Drug use: No     Allergies   Tape and Amlodipine   Review of Systems Review of Systems  Constitutional:  Positive for fatigue. Negative for chills, diaphoresis and fever.  HENT:  Positive for congestion, postnasal drip, rhinorrhea and sinus pressure. Negative for ear pain, sinus pain and sore throat.   Respiratory:  Positive for cough. Negative for shortness of breath.   Gastrointestinal:  Negative for abdominal pain, diarrhea, nausea and vomiting.  Musculoskeletal:  Negative for arthralgias and myalgias.  Skin:  Negative for rash.  Neurological:  Positive for headaches. Negative for weakness.  Hematological:  Negative for adenopathy.    Physical Exam Triage Vital Signs ED Triage Vitals  Enc Vitals Group     BP      Pulse      Resp      Temp      Temp src      SpO2      Weight      Height      Head Circumference      Peak Flow      Pain Score      Pain Loc      Pain Edu?      Excl. in [place]?    No data found.  Updated Vital Signs BP 126/76 (BP Location: Left Arm)   Pulse 62   Temp 98.4 F (36.9 C) (Oral)   Resp (!) 22   SpO2 98%      Physical Exam Vitals and nursing note reviewed.  Constitutional:      General: She is not in acute distress.    Appearance: Normal appearance. She is ill-appearing. She is not toxic-appearing.  HENT:     Head: Normocephalic and atraumatic.     Right Ear: Tympanic membrane, ear canal and external ear normal.     Left Ear: Tympanic membrane, ear canal and external ear normal.     Nose: Congestion present.     Mouth/Throat:     Mouth: Mucous membranes are moist.     Pharynx: Oropharynx is clear. Posterior oropharyngeal erythema (mild with clear PND) present.  Eyes:     General: No scleral icterus.  Right eye: No discharge.        Left eye: No discharge.      Conjunctiva/sclera: Conjunctivae normal.  Cardiovascular:     Rate and Rhythm: Normal rate and regular rhythm.     Heart sounds: Normal heart sounds.  Pulmonary:     Effort: Pulmonary effort is normal. No respiratory distress.     Breath sounds: Normal breath sounds.  Musculoskeletal:     Cervical back: Neck supple.  Skin:    General: Skin is dry.  Neurological:     General: No focal deficit present.     Mental Status: She is alert. Mental status is at baseline.     Motor: No weakness.     Gait: Gait normal.  Psychiatric:        Mood and Affect: Mood normal.        Behavior: Behavior normal.        Thought Content: Thought content normal.     UC Treatments / Results  Labs (all labs ordered are listed, but only abnormal results are displayed) Labs Reviewed - No data to display  EKG   Radiology No results found.  Procedures Procedures (including critical care time)  Medications Ordered in UC Medications - No data to display  Initial Impression / Assessment and Plan / UC Course  I have reviewed the triage vital signs and the nursing notes.  Pertinent labs & imaging results that were available during my care of the patient were reviewed by me and considered in my medical decision making (see chart for details).  76 year old female presenting for 3-day history of nasal congestion, sinus pressure and cough.  No associated fevers.  Denies sinus pain, sore throat or difficulty breathing.  2 negative COVID tests at home.  Vitals are stable.  She is mildly ill-appearing but nontoxic.  Nasal congestion as well as erythema posterior pharynx with clear postnasal drainage present.  Chest clear to auscultation in all lung fields.  Discussed with patient retesting for COVID or flu but I have low suspicion for those illnesses given her negative test at home and the fact that we have not seen any recent influenza cases.  Advised likely another viral illness.  Advised supportive care  at home.  Sent Promethazine DM for her to take at night and advised her to continue with the nasal spray.  Reviewed return and ER precautions.   Final Clinical Impressions(s) / UC Diagnoses   Final diagnoses:  Viral URI with cough     Discharge Instructions      URI/COLD SYMPTOMS: Your exam today is consistent with a viral illness. Antibiotics are not indicated at this time. Use medications as directed, including cough syrup, nasal saline, and decongestants. Your symptoms should improve over the next few days and resolve within 7-10 days. Increase rest and fluids. F/u if symptoms worsen or predominate such as sore throat, ear pain, productive cough, shortness of breath, or if you develop high fevers or worsening fatigue over the next several days.       ED Prescriptions     Medication Sig Dispense Auth. Provider   promethazine-dextromethorphan (PROMETHAZINE-DM) 6.25-15 MG/5ML syrup Take 5 mLs by mouth 4 (four) times daily as needed. 118 mL Danton Clap, PA-C      PDMP not reviewed this encounter.   Danton Clap, PA-C 03/13/22 [phone]

## 2022-03-17 DIAGNOSIS — Z03818 Encounter for observation for suspected exposure to other biological agents ruled out: Secondary | ICD-10-CM | POA: Diagnosis not present

## 2022-03-17 DIAGNOSIS — J988 Other specified respiratory disorders: Secondary | ICD-10-CM | POA: Diagnosis not present

## 2022-03-17 DIAGNOSIS — R059 Cough, unspecified: Secondary | ICD-10-CM | POA: Diagnosis not present

## 2022-03-17 DIAGNOSIS — H109 Unspecified conjunctivitis: Secondary | ICD-10-CM | POA: Diagnosis not present

## 2022-04-13 ENCOUNTER — Other Ambulatory Visit: Payer: Self-pay

## 2022-04-13 ENCOUNTER — Emergency Department: Payer: Medicare PPO

## 2022-04-13 ENCOUNTER — Emergency Department
Admission: EM | Admit: 2022-04-13 | Discharge: 2022-04-13 | Disposition: A | Discharge status: Home / Self Care | Payer: Medicare PPO | Attending: Emergency Medicine | Admitting: Emergency Medicine

## 2022-04-13 DIAGNOSIS — R1110 Vomiting, unspecified: Secondary | ICD-10-CM | POA: Diagnosis not present

## 2022-04-13 DIAGNOSIS — Z9889 Other specified postprocedural states: Secondary | ICD-10-CM | POA: Diagnosis not present

## 2022-04-13 DIAGNOSIS — R110 Nausea: Secondary | ICD-10-CM | POA: Diagnosis not present

## 2022-04-13 DIAGNOSIS — I10 Essential (primary) hypertension: Secondary | ICD-10-CM | POA: Insufficient documentation

## 2022-04-13 DIAGNOSIS — R42 Dizziness and giddiness: Secondary | ICD-10-CM | POA: Diagnosis not present

## 2022-04-13 LAB — CBG MONITORING, ED: Glucose-Capillary: 142 mg/dL — ABNORMAL HIGH (ref 70–99)

## 2022-04-13 LAB — CBC
HCT: 41 % (ref 36.0–46.0)
Hemoglobin: 13.8 g/dL (ref 12.0–15.0)
MCH: 28 pg (ref 26.0–34.0)
MCHC: 33.7 g/dL (ref 30.0–36.0)
MCV: 83.2 fL (ref 80.0–100.0)
Platelets: 240 10*3/uL (ref 150–400)
RBC: 4.93 MIL/uL (ref 3.87–5.11)
RDW: 12.2 % (ref 11.5–15.5)
WBC: 9.7 10*3/uL (ref 4.0–10.5)
nRBC: 0 % (ref 0.0–0.2)

## 2022-04-13 LAB — BASIC METABOLIC PANEL
Anion gap: 10 (ref 5–15)
BUN: 13 mg/dL (ref 8–23)
CO2: 21 mmol/L — ABNORMAL LOW (ref 22–32)
Calcium: 9 mg/dL (ref 8.9–10.3)
Chloride: 102 mmol/L (ref 98–111)
Creatinine, Ser: 0.58 mg/dL (ref 0.44–1.00)
GFR, Estimated: >60 mL/min (ref >60)
Glucose, Bld: 150 mg/dL — ABNORMAL HIGH (ref 70–99)
Potassium: 3.7 mmol/L (ref 3.5–5.1)
Sodium: 133 mmol/L — ABNORMAL LOW (ref 135–145)

## 2022-04-13 LAB — URINALYSIS, ROUTINE W REFLEX MICROSCOPIC
Bilirubin Urine: NEGATIVE
Glucose, UA: NEGATIVE mg/dL
Hgb urine dipstick: NEGATIVE
Ketones, ur: 20 mg/dL — AB
Leukocytes,Ua: NEGATIVE
Nitrite: NEGATIVE
Protein, ur: NEGATIVE mg/dL
Specific Gravity, Urine: 1.012 (ref 1.005–1.030)
pH: 9 — ABNORMAL HIGH (ref 5.0–8.0)

## 2022-04-13 LAB — TROPONIN I (HIGH SENSITIVITY)
Troponin I (High Sensitivity): 2 ng/L (ref <18)
Troponin I (High Sensitivity): 3 ng/L (ref <18)

## 2022-04-13 MED ORDER — MECLIZINE HCL 25 MG PO TABS: 25.0000 mg | ORAL_TABLET | Freq: Three times a day (TID) | ORAL | 0 refills | Status: AC | PRN

## 2022-04-13 MED ORDER — MECLIZINE HCL 25 MG PO TABS
12.5000 mg | ORAL_TABLET | Freq: Once | ORAL | Status: DC
  Filled 2022-04-13: qty 1

## 2022-04-13 MED ORDER — MECLIZINE HCL 25 MG PO TABS
25.0000 mg | ORAL_TABLET | Freq: Once | ORAL | Status: AC
  Administered 2022-04-13: 25 mg via ORAL

## 2022-04-13 MED ORDER — ONDANSETRON 4 MG PO TBDP
4.0000 mg | ORAL_TABLET | Freq: Once | ORAL | Status: AC
  Administered 2022-04-13: 4 mg via ORAL
  Filled 2022-04-13: qty 1

## 2022-04-13 NOTE — ED Provider Notes (Signed)
[facility] Provider Note    Event Date/Time   First MD Initiated Contact with Patient 04/13/22 1826     (approximate)  History   Chief Complaint: Dizziness and Emesis  HPI  Christina Knight is a 76 y.o. female with a past medical history of gastric reflux, hypertension, hyperlipidemia, presents to the emergency department for cute onset of dizziness nausea and ataxia.  According to the patient since around 2:30 PM today she has been feeling dizzy which she describes more as a spinning sensation but more so is that she feels off balance having to hold onto walls or furniture while walking to keep from falling.  Patient denies any prior history of vertigo.  Denies any other complaints no weakness numbness of any arm or leg confusion or difficulty speaking or thinking.  No visual changes.  Patient received meclizine in the emergency department states dizziness is possibly somewhat improved although still present.  Physical Exam   Triage Vital Signs: ED Triage Vitals  Enc Vitals Group     BP 04/13/22 1712 (!) 159/96     Pulse Rate 04/13/22 1712 74     Resp 04/13/22 1712 20     Temp 04/13/22 1907 97.6 F (36.4 C)     Temp Source 04/13/22 1907 Oral     SpO2 04/13/22 1712 97 %     Weight 04/13/22 1712 136 lb (61.7 kg)     Height 04/13/22 1712 '5\' 3"'$  (1.6 m)     Head Circumference --      Peak Flow --      Pain Score 04/13/22 1712 0     Pain Loc --      Pain Edu? --      Excl. in [place]? --     Most recent vital signs: Vitals:   04/13/22 2000 04/13/22 2030  BP: (!) 145/77 (!) 146/129  Pulse: 78 82  Resp: 17 15  Temp:    SpO2: 96% 97%    General: Awake, no distress.  CV:  Good peripheral perfusion.  Regular rate and rhythm  Resp:  Normal effort.  Equal breath sounds bilaterally.  Abd:  No distention.  Soft, nontender.  No rebound or guarding. Other:  Equal grip strength bilaterally with no pronator drift.  Cranial nerves intact.  5/5 motor in all  extremities.   ED Results / Procedures / Treatments   EKG  EKG viewed and interpreted by myself shows a normal sinus rhythm at 78 bpm with a narrow QRS, normal axis, normal intervals, no concerning ST changes.  RADIOLOGY  I have viewed and interpreted the CT scan of the head images.  No significant abnormality seen on my evaluation. CT scan read as negative by radiology.  MEDICATIONS ORDERED IN ED: Medications  ondansetron (ZOFRAN-ODT) disintegrating tablet 4 mg (4 mg Oral Given 04/13/22 1725)  meclizine (ANTIVERT) tablet 25 mg (25 mg Oral Given 04/13/22 1725)     IMPRESSION / MDM / ASSESSMENT AND PLAN / ED COURSE  I reviewed the triage vital signs and the nursing notes.  Patient's presentation is most consistent with acute presentation with potential threat to life or bodily function.  Patient presents emergency department for dizziness/off-balance sensation since around 2:30 PM today.  Overall the patient appears well, reassuring physical exam patient is moderately hypertensive otherwise reassuring vitals.  Normal neurological exam.  Patient's labs have resulted showing a reassuring urinalysis besides mild dehydration with ketones of 20.  Chemistry shows no significant findings.  Troponin negative x2.  CBC normal.  CT scan of the head shows no significant findings.  However given the patient's persistent dizziness no history of vertigo and ataxia we will proceed with MR imaging of the brain to rule out CVA.  Patient agreeable to plan of care.  MRI is negative for acute abnormality.  Patient's lab work is reassuring.  We will start the patient on meclizine and refer to ENT.  Patient agreeable to plan of care.  FINAL CLINICAL IMPRESSION(S) / ED DIAGNOSES   Dizziness Vertigo   Note:  This document was prepared using Dragon voice recognition software and may include unintentional dictation errors.   Harvest Dark, MD 04/13/22 2245

## 2022-04-13 NOTE — ED Triage Notes (Signed)
Pt to ED with daughter for dizziness, nystagmus, and vomiting since 1420 today. Has had occasional prior episodes of dizziness in past. Denies room spinning but states had to hold onto furniture when walking. No staggering noted by daughter. Pt is a&0 times 4, NIH negative. 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 Pt reports no vision changes.   Took antihistamine at 1500. Unsure which type. Pt is slightly drowsy. States is mildly dizzy. Movement makes dizziness worse. Pt did walk to car holding onto daughter to come here.  States turning head makes her feel nauseous.  Spoke with EDP, not calling code stroke at this time. CT head was ordered.

## 2022-04-13 NOTE — ED Notes (Signed)
RN first encounter with pt prior to discharge. Pt verbalized understanding of discharge instructions and follow-up care instructions. Pt advised if symptoms worsen to return to ED.

## 2022-05-22 DIAGNOSIS — R42 Dizziness and giddiness: Secondary | ICD-10-CM | POA: Diagnosis not present

## 2022-06-02 DIAGNOSIS — M6789 Other specified disorders of synovium and tendon, multiple sites: Secondary | ICD-10-CM | POA: Diagnosis not present

## 2022-06-02 DIAGNOSIS — M7139 Other bursal cyst, multiple sites: Secondary | ICD-10-CM | POA: Diagnosis not present

## 2022-06-02 DIAGNOSIS — M79672 Pain in left foot: Secondary | ICD-10-CM | POA: Diagnosis not present

## 2022-06-02 DIAGNOSIS — M67472 Ganglion, left ankle and foot: Secondary | ICD-10-CM | POA: Diagnosis not present

## 2022-06-24 DIAGNOSIS — Z6823 Body mass index (BMI) 23.0-23.9, adult: Secondary | ICD-10-CM | POA: Diagnosis not present

## 2022-06-24 DIAGNOSIS — N3281 Overactive bladder: Secondary | ICD-10-CM | POA: Diagnosis not present

## 2022-06-24 DIAGNOSIS — N393 Stress incontinence (female) (male): Secondary | ICD-10-CM | POA: Diagnosis not present

## 2022-06-30 DIAGNOSIS — R42 Dizziness and giddiness: Secondary | ICD-10-CM | POA: Diagnosis not present

## 2022-06-30 DIAGNOSIS — H903 Sensorineural hearing loss, bilateral: Secondary | ICD-10-CM | POA: Diagnosis not present

## 2022-07-08 ENCOUNTER — Other Ambulatory Visit: Payer: Self-pay | Admitting: Gastroenterology

## 2022-07-08 DIAGNOSIS — K219 Gastro-esophageal reflux disease without esophagitis: Secondary | ICD-10-CM | POA: Diagnosis not present

## 2022-07-08 DIAGNOSIS — R141 Gas pain: Secondary | ICD-10-CM

## 2022-07-08 DIAGNOSIS — R1013 Epigastric pain: Secondary | ICD-10-CM | POA: Diagnosis not present

## 2022-07-14 ENCOUNTER — Ambulatory Visit
Admission: RE | Admit: 2022-07-14 | Discharge: 2022-07-14 | Disposition: A | Discharge status: Home / Self Care | Payer: Medicare PPO | Source: Ambulatory Visit | Attending: Gastroenterology | Admitting: Gastroenterology

## 2022-07-14 DIAGNOSIS — R1013 Epigastric pain: Secondary | ICD-10-CM | POA: Diagnosis not present

## 2022-07-14 DIAGNOSIS — R141 Gas pain: Secondary | ICD-10-CM

## 2022-07-14 DIAGNOSIS — K219 Gastro-esophageal reflux disease without esophagitis: Secondary | ICD-10-CM

## 2022-07-14 DIAGNOSIS — Z8719 Personal history of other diseases of the digestive system: Secondary | ICD-10-CM | POA: Diagnosis not present

## 2022-07-25 DIAGNOSIS — M4322 Fusion of spine, cervical region: Secondary | ICD-10-CM | POA: Diagnosis not present

## 2022-07-25 DIAGNOSIS — M7918 Myalgia, other site: Secondary | ICD-10-CM | POA: Diagnosis not present

## 2022-08-05 ENCOUNTER — Other Ambulatory Visit: Payer: Self-pay | Admitting: General Surgery

## 2022-08-05 DIAGNOSIS — K824 Cholesterolosis of gallbladder: Secondary | ICD-10-CM | POA: Diagnosis not present

## 2022-08-05 MED ORDER — SPY AGENT GREEN - (INDOCYANINE FOR INJECTION)
1.2500 mg | Freq: Once | INTRAMUSCULAR | Status: AC
  Administered 2022-09-10: 7.5 mg via INTRAVENOUS

## 2022-08-11 DIAGNOSIS — R42 Dizziness and giddiness: Secondary | ICD-10-CM | POA: Diagnosis not present

## 2022-08-11 DIAGNOSIS — M542 Cervicalgia: Secondary | ICD-10-CM | POA: Diagnosis not present

## 2022-08-18 DIAGNOSIS — R42 Dizziness and giddiness: Secondary | ICD-10-CM | POA: Diagnosis not present

## 2022-08-18 DIAGNOSIS — M542 Cervicalgia: Secondary | ICD-10-CM | POA: Diagnosis not present

## 2022-08-20 DIAGNOSIS — Z86010 Personal history of colonic polyps: Secondary | ICD-10-CM | POA: Diagnosis not present

## 2022-08-20 DIAGNOSIS — D123 Benign neoplasm of transverse colon: Secondary | ICD-10-CM | POA: Diagnosis not present

## 2022-08-20 DIAGNOSIS — K5730 Diverticulosis of large intestine without perforation or abscess without bleeding: Secondary | ICD-10-CM | POA: Diagnosis not present

## 2022-08-20 DIAGNOSIS — K648 Other hemorrhoids: Secondary | ICD-10-CM | POA: Diagnosis not present

## 2022-08-20 DIAGNOSIS — Z09 Encounter for follow-up examination after completed treatment for conditions other than malignant neoplasm: Secondary | ICD-10-CM | POA: Diagnosis not present

## 2022-08-25 DIAGNOSIS — D123 Benign neoplasm of transverse colon: Secondary | ICD-10-CM | POA: Diagnosis not present

## 2022-08-25 DIAGNOSIS — M542 Cervicalgia: Secondary | ICD-10-CM | POA: Diagnosis not present

## 2022-08-25 DIAGNOSIS — R2681 Unsteadiness on feet: Secondary | ICD-10-CM | POA: Diagnosis not present

## 2022-08-25 DIAGNOSIS — R42 Dizziness and giddiness: Secondary | ICD-10-CM | POA: Diagnosis not present

## 2022-08-26 DIAGNOSIS — M542 Cervicalgia: Secondary | ICD-10-CM | POA: Diagnosis not present

## 2022-08-27 NOTE — Pre-Procedure Instructions (Signed)
Surgical Instructions    Your procedure is scheduled on Wednesday, September 10, 2022.  Report to [facility] Main Entrance "A" at 11:00 A.M., then check in with the Admitting office.  Call this number if you have problems the morning of surgery:  [phone]   If you have any questions prior to your surgery date call [phone]: Open Monday-Friday 8am-4pm If you experience any cold or flu symptoms such as cough, fever, chills, shortness of breath, etc. between now and your scheduled surgery, please notify us at the above number     Remember:  Do not eat after midnight the night before your surgery  You may drink clear liquids until 10:00 am the morning of your surgery.   Clear liquids allowed are: Water, Non-Citrus Juices (without pulp), Carbonated Beverages, Clear Tea, Black Coffee ONLY (NO MILK, CREAM OR POWDERED CREAMER of any kind), and Gatorade    Take these medicines the morning of surgery with A SIP OF WATER:   carvedilol (COREG)  escitalopram (LEXAPRO)  omeprazole (PRILOSEC)  estradiol (ESTRACE)  buPROPion (WELLBUTRIN XL)  solifenacin (VESICARE)  rosuvastatin (CRESTOR)   As needed: meclizine (ANTIVERT)   As of today, STOP taking any Aspirin (unless otherwise instructed by your surgeon) Aleve, Naproxen, Ibuprofen, Motrin, Advil, Goody's, BC's, all herbal medications, fish oil, and all vitamins.           Do not wear jewelry or makeup. Do not wear lotions, powders, perfumes/cologne or deodorant. Do not shave 48 hours prior to surgery.  Do not bring valuables to the hospital. Do not wear nail polish, gel polish, artificial nails, or any other type of covering on natural nails (fingers and toes) If you have artificial nails or gel coating that need to be removed by a nail salon, please have this removed prior to surgery. Artificial nails or gel coating may interfere with anesthesia's ability to adequately monitor your vital signs.  [facility] is not responsible for any  belongings or valuables.    Do NOT Smoke (Tobacco/Vaping)  24 hours prior to your procedure  If you use a CPAP at night, you may bring your mask for your overnight stay.   Contacts, glasses, hearing aids, dentures or partials may not be worn into surgery, please bring cases for these belongings   For patients admitted to the hospital, discharge time will be determined by your treatment team.   Patients discharged the day of surgery will not be allowed to drive home, and someone needs to stay with them for 24 hours.   SURGICAL WAITING ROOM VISITATION Patients having surgery or a procedure may have no more than 2 support people in the waiting area - these visitors may rotate.   Children under the age of 33 must have an adult with them who is not the patient. If the patient needs to stay at the hospital during part of their recovery, the visitor guidelines for inpatient rooms apply. Pre-op nurse will coordinate an appropriate time for 1 support person to accompany patient in pre-op.  This support person may not rotate.   Please refer to [email or web address] for the visitor guidelines for Inpatients (after your surgery is over and you are in a regular room).    Special instructions:    Oral Hygiene is also important to reduce your risk of infection.  Remember - BRUSH YOUR TEETH THE MORNING OF SURGERY WITH YOUR REGULAR TOOTHPASTE   [place]- Preparing For Surgery  Before surgery, you can play an important role. Because skin  is not sterile, your skin needs to be as free of germs as possible. You can reduce the number of germs on your skin by washing with CHG (chlorahexidine gluconate) Soap before surgery.  CHG is an antiseptic cleaner which kills germs and bonds with the skin to continue killing germs even after washing.     Please do not use if you have an allergy to CHG or antibacterial soaps. If your skin becomes reddened/irritated stop  using the CHG.  Do not shave (including legs and underarms) for at least 48 hours prior to first CHG shower. It is OK to shave your face.  Please follow these instructions carefully.     Shower the NIGHT BEFORE SURGERY and the MORNING OF SURGERY with CHG Soap.   If you chose to wash your hair, wash your hair first as usual with your normal shampoo. After you shampoo, rinse your hair and body thoroughly to remove the shampoo.  Then [employer] and genitals (private parts) with your normal soap and rinse thoroughly to remove soap.  After that Use CHG Soap as you would any other liquid soap. You can apply CHG directly to the skin and wash gently with a scrungie or a clean washcloth.   Apply the CHG Soap to your body ONLY FROM THE NECK DOWN.  Do not use on open wounds or open sores. Avoid contact with your eyes, ears, mouth and genitals (private parts). Wash Face and genitals (private parts)  with your normal soap.   Wash thoroughly, paying special attention to the area where your surgery will be performed.  Thoroughly rinse your body with warm water from the neck down.  DO NOT shower/wash with your normal soap after using and rinsing off the CHG Soap.  Pat yourself dry with a CLEAN TOWEL.  Wear CLEAN PAJAMAS to bed the night before surgery  Place CLEAN SHEETS on your bed the night before your surgery  DO NOT SLEEP WITH PETS.   Day of Surgery:  Take a shower with CHG soap. Wear Clean/Comfortable clothing the morning of surgery Do not apply any deodorants/lotions.   Remember to brush your teeth WITH YOUR REGULAR TOOTHPASTE.    If you received a COVID test during your pre-op visit, it is requested that you wear a mask when out in public, stay away from anyone that may not be feeling well, and notify your surgeon if you develop symptoms. If you have been in contact with anyone that has tested positive in the last 10 days, please notify your surgeon.    Please read over the following  fact sheets that you were given.

## 2022-08-28 ENCOUNTER — Encounter (HOSPITAL_COMMUNITY)
Admission: RE | Admit: 2022-08-28 | Discharge: 2022-08-28 | Disposition: A | Discharge status: Home / Self Care | Payer: Medicare PPO | Source: Ambulatory Visit | Attending: General Surgery | Admitting: General Surgery

## 2022-08-28 ENCOUNTER — Other Ambulatory Visit: Payer: Self-pay

## 2022-08-28 ENCOUNTER — Encounter (HOSPITAL_COMMUNITY): Payer: Self-pay

## 2022-08-28 VITALS — Ht 64.0 in | Wt 131.0 lb

## 2022-08-28 DIAGNOSIS — Z01818 Encounter for other preprocedural examination: Secondary | ICD-10-CM

## 2022-08-28 DIAGNOSIS — I10 Essential (primary) hypertension: Secondary | ICD-10-CM | POA: Diagnosis not present

## 2022-08-28 DIAGNOSIS — Z01812 Encounter for preprocedural laboratory examination: Secondary | ICD-10-CM | POA: Diagnosis not present

## 2022-08-28 HISTORY — DX: Other specified postprocedural states: Z98.890

## 2022-08-28 HISTORY — DX: Depression, unspecified: F32.A

## 2022-08-28 HISTORY — DX: Family history of other specified conditions: Z84.89

## 2022-08-28 HISTORY — DX: Anxiety disorder, unspecified: F41.9

## 2022-08-28 HISTORY — DX: Other complications of anesthesia, initial encounter: T88.59XA

## 2022-08-28 LAB — CBC
HCT: 40.4 % (ref 36.0–46.0)
Hemoglobin: 13.5 g/dL (ref 12.0–15.0)
MCH: 29 pg (ref 26.0–34.0)
MCHC: 33.4 g/dL (ref 30.0–36.0)
MCV: 86.7 fL (ref 80.0–100.0)
Platelets: 236 10*3/uL (ref 150–400)
RBC: 4.66 MIL/uL (ref 3.87–5.11)
RDW: 12.2 % (ref 11.5–15.5)
WBC: 6.8 10*3/uL (ref 4.0–10.5)
nRBC: 0 % (ref 0.0–0.2)

## 2022-08-28 LAB — BASIC METABOLIC PANEL
Anion gap: 9 (ref 5–15)
BUN: 11 mg/dL (ref 8–23)
CO2: 26 mmol/L (ref 22–32)
Calcium: 9.1 mg/dL (ref 8.9–10.3)
Chloride: 104 mmol/L (ref 98–111)
Creatinine, Ser: 0.71 mg/dL (ref 0.44–1.00)
GFR, Estimated: >60 mL/min (ref >60)
Glucose, Bld: 108 mg/dL — ABNORMAL HIGH (ref 70–99)
Potassium: 4.5 mmol/L (ref 3.5–5.1)
Sodium: 139 mmol/L (ref 135–145)

## 2022-08-28 NOTE — Progress Notes (Signed)
PCP - [facility] - Pt says doctor keeps changing due to staffing changes. Cardiologist - N/A  PPM/ICD - Denies  Chest x-ray - N/A EKG - 04/13/22 Stress Test - 2015 ECHO - 08/18/2014 Cardiac Cath - Denies  Sleep Study - Denies  Diabetes: Denies  Blood Thinner Instructions: N/A Aspirin Instructions: N/A  ERAS Protcol - Yes PRE-SURGERY Ensure or G2- No  COVID TEST- N/A   Anesthesia review: Yes, previous EKG abnormal  Patient denies shortness of breath, fever, cough and chest pain at PAT appointment   All instructions explained to the patient, with a verbal understanding of the material. Patient agrees to go over the instructions while at home for a better understanding. Patient also instructed to self quarantine after being tested for COVID-19. The opportunity to ask questions was provided.

## 2022-09-01 DIAGNOSIS — M542 Cervicalgia: Secondary | ICD-10-CM | POA: Diagnosis not present

## 2022-09-01 DIAGNOSIS — R42 Dizziness and giddiness: Secondary | ICD-10-CM | POA: Diagnosis not present

## 2022-09-01 DIAGNOSIS — R2681 Unsteadiness on feet: Secondary | ICD-10-CM | POA: Diagnosis not present

## 2022-09-08 DIAGNOSIS — R42 Dizziness and giddiness: Secondary | ICD-10-CM | POA: Diagnosis not present

## 2022-09-08 DIAGNOSIS — R2681 Unsteadiness on feet: Secondary | ICD-10-CM | POA: Diagnosis not present

## 2022-09-08 DIAGNOSIS — M542 Cervicalgia: Secondary | ICD-10-CM | POA: Diagnosis not present

## 2022-09-10 ENCOUNTER — Encounter (HOSPITAL_COMMUNITY)
Admission: RE | Disposition: A | Discharge status: Home / Self Care | Payer: Self-pay | Source: Home / Self Care | Attending: General Surgery

## 2022-09-10 ENCOUNTER — Ambulatory Visit (HOSPITAL_COMMUNITY)
Admission: RE | Admit: 2022-09-10 | Discharge: 2022-09-10 | Disposition: A | Discharge status: Home / Self Care | Payer: Medicare PPO | Attending: General Surgery | Admitting: General Surgery

## 2022-09-10 ENCOUNTER — Other Ambulatory Visit: Payer: Self-pay

## 2022-09-10 ENCOUNTER — Ambulatory Visit (HOSPITAL_BASED_OUTPATIENT_CLINIC_OR_DEPARTMENT_OTHER): Payer: Medicare PPO | Admitting: Physician Assistant

## 2022-09-10 ENCOUNTER — Encounter (HOSPITAL_COMMUNITY): Payer: Self-pay | Admitting: General Surgery

## 2022-09-10 ENCOUNTER — Ambulatory Visit (HOSPITAL_COMMUNITY): Payer: Medicare PPO | Admitting: Physician Assistant

## 2022-09-10 DIAGNOSIS — K8044 Calculus of bile duct with chronic cholecystitis without obstruction: Secondary | ICD-10-CM | POA: Diagnosis not present

## 2022-09-10 DIAGNOSIS — Z853 Personal history of malignant neoplasm of breast: Secondary | ICD-10-CM | POA: Insufficient documentation

## 2022-09-10 DIAGNOSIS — K824 Cholesterolosis of gallbladder: Secondary | ICD-10-CM | POA: Diagnosis not present

## 2022-09-10 DIAGNOSIS — K828 Other specified diseases of gallbladder: Secondary | ICD-10-CM | POA: Diagnosis not present

## 2022-09-10 DIAGNOSIS — Z87891 Personal history of nicotine dependence: Secondary | ICD-10-CM | POA: Insufficient documentation

## 2022-09-10 DIAGNOSIS — G8918 Other acute postprocedural pain: Secondary | ICD-10-CM | POA: Diagnosis not present

## 2022-09-10 DIAGNOSIS — I10 Essential (primary) hypertension: Secondary | ICD-10-CM | POA: Insufficient documentation

## 2022-09-10 DIAGNOSIS — K219 Gastro-esophageal reflux disease without esophagitis: Secondary | ICD-10-CM | POA: Diagnosis not present

## 2022-09-10 DIAGNOSIS — K8050 Calculus of bile duct without cholangitis or cholecystitis without obstruction: Secondary | ICD-10-CM | POA: Diagnosis present

## 2022-09-10 DIAGNOSIS — K811 Chronic cholecystitis: Secondary | ICD-10-CM | POA: Diagnosis not present

## 2022-09-10 HISTORY — PX: CHOLECYSTECTOMY: SHX55

## 2022-09-10 SURGERY — LAPAROSCOPIC CHOLECYSTECTOMY
Anesthesia: Regional | Site: Abdomen

## 2022-09-10 MED ORDER — CHLORHEXIDINE GLUCONATE CLOTH 2 % EX PADS: 6.0000 | MEDICATED_PAD | Freq: Once | CUTANEOUS | Status: DC

## 2022-09-10 MED ORDER — PHENYLEPHRINE HCL-NACL 20-0.9 MG/250ML-% IV SOLN
INTRAVENOUS | Status: DC | PRN
  Administered 2022-09-10: 20 ug/min via INTRAVENOUS

## 2022-09-10 MED ORDER — ACETAMINOPHEN 650 MG RE SUPP: 650.0000 mg | RECTAL | Status: DC | PRN

## 2022-09-10 MED ORDER — 0.9 % SODIUM CHLORIDE (POUR BTL) OPTIME
TOPICAL | Status: DC | PRN
  Administered 2022-09-10: 1000 mL

## 2022-09-10 MED ORDER — LIDOCAINE 2% (20 MG/ML) 5 ML SYRINGE
INTRAMUSCULAR | Status: DC | PRN
  Administered 2022-09-10: 60 mg via INTRAVENOUS

## 2022-09-10 MED ORDER — SUGAMMADEX SODIUM 200 MG/2ML IV SOLN
INTRAVENOUS | Status: DC | PRN
  Administered 2022-09-10: 150 mg via INTRAVENOUS

## 2022-09-10 MED ORDER — CHLORHEXIDINE GLUCONATE 0.12 % MT SOLN
15.0000 mL | Freq: Once | OROMUCOSAL | Status: AC
  Administered 2022-09-10: 15 mL via OROMUCOSAL
  Filled 2022-09-10: qty 15

## 2022-09-10 MED ORDER — SODIUM CHLORIDE 0.9% FLUSH: 3.0000 mL | Freq: Two times a day (BID) | INTRAVENOUS | Status: DC

## 2022-09-10 MED ORDER — OXYCODONE HCL 5 MG/5ML PO SOLN: 5.0000 mg | Freq: Once | ORAL | Status: DC | PRN

## 2022-09-10 MED ORDER — SODIUM CHLORIDE 0.9 % IR SOLN
Status: DC | PRN
  Administered 2022-09-10: 1000 mL

## 2022-09-10 MED ORDER — HYDROMORPHONE HCL 1 MG/ML IJ SOLN: 0.2500 mg | INTRAMUSCULAR | Status: DC | PRN

## 2022-09-10 MED ORDER — METOCLOPRAMIDE HCL 5 MG/ML IJ SOLN
INTRAMUSCULAR | Status: DC | PRN
  Administered 2022-09-10: 5 mg via INTRAVENOUS

## 2022-09-10 MED ORDER — ONDANSETRON HCL 4 MG/2ML IJ SOLN: 4.0000 mg | Freq: Once | INTRAMUSCULAR | Status: DC | PRN

## 2022-09-10 MED ORDER — OXYCODONE HCL 5 MG PO TABS: 5.0000 mg | ORAL_TABLET | Freq: Once | ORAL | Status: DC | PRN

## 2022-09-10 MED ORDER — ORAL CARE MOUTH RINSE: 15.0000 mL | Freq: Once | OROMUCOSAL | Status: AC

## 2022-09-10 MED ORDER — TRAMADOL HCL 50 MG PO TABS: 50.0000 mg | ORAL_TABLET | Freq: Four times a day (QID) | ORAL | 0 refills | Status: AC | PRN

## 2022-09-10 MED ORDER — ROCURONIUM BROMIDE 10 MG/ML (PF) SYRINGE
PREFILLED_SYRINGE | INTRAVENOUS | Status: DC | PRN
  Administered 2022-09-10: 50 mg via INTRAVENOUS

## 2022-09-10 MED ORDER — CEFAZOLIN SODIUM-DEXTROSE 2-4 GM/100ML-% IV SOLN
2.0000 g | INTRAVENOUS | Status: AC
  Administered 2022-09-10: 2 g via INTRAVENOUS
  Filled 2022-09-10: qty 100

## 2022-09-10 MED ORDER — SODIUM CHLORIDE 0.9% FLUSH: 3.0000 mL | INTRAVENOUS | Status: DC | PRN

## 2022-09-10 MED ORDER — MIDAZOLAM HCL 2 MG/2ML IJ SOLN: 1.0000 mg | Freq: Once | INTRAMUSCULAR | Status: AC

## 2022-09-10 MED ORDER — ONDANSETRON HCL 4 MG/2ML IJ SOLN
INTRAMUSCULAR | Status: DC | PRN
  Administered 2022-09-10: 4 mg via INTRAVENOUS

## 2022-09-10 MED ORDER — PROPOFOL 10 MG/ML IV BOLUS
INTRAVENOUS | Status: DC | PRN
  Administered 2022-09-10: 100 mg via INTRAVENOUS

## 2022-09-10 MED ORDER — MIDAZOLAM HCL 2 MG/2ML IJ SOLN
INTRAMUSCULAR | Status: AC
  Administered 2022-09-10: 1 mg via INTRAVENOUS
  Filled 2022-09-10: qty 2

## 2022-09-10 MED ORDER — DEXAMETHASONE SODIUM PHOSPHATE 10 MG/ML IJ SOLN
INTRAMUSCULAR | Status: DC | PRN
  Administered 2022-09-10: 5 mg via INTRAVENOUS

## 2022-09-10 MED ORDER — ACETAMINOPHEN 325 MG PO TABS: 650.0000 mg | ORAL_TABLET | ORAL | Status: DC | PRN

## 2022-09-10 MED ORDER — ACETAMINOPHEN 500 MG PO TABS
1000.0000 mg | ORAL_TABLET | ORAL | Status: AC
  Administered 2022-09-10: 1000 mg via ORAL

## 2022-09-10 MED ORDER — BUPIVACAINE LIPOSOME 1.3 % IJ SUSP
INTRAMUSCULAR | Status: DC | PRN
  Administered 2022-09-10: 10 mL via PERINEURAL

## 2022-09-10 MED ORDER — SODIUM CHLORIDE 0.9 % IV SOLN: 250.0000 mL | INTRAVENOUS | Status: DC | PRN

## 2022-09-10 MED ORDER — FENTANYL CITRATE (PF) 250 MCG/5ML IJ SOLN
INTRAMUSCULAR | Status: AC
  Filled 2022-09-10: qty 5

## 2022-09-10 MED ORDER — FENTANYL CITRATE (PF) 250 MCG/5ML IJ SOLN
INTRAMUSCULAR | Status: DC | PRN
  Administered 2022-09-10: 50 ug via INTRAVENOUS

## 2022-09-10 MED ORDER — EPHEDRINE SULFATE-NACL 50-0.9 MG/10ML-% IV SOSY
PREFILLED_SYRINGE | INTRAVENOUS | Status: DC | PRN
  Administered 2022-09-10 (×2): 10 mg via INTRAVENOUS

## 2022-09-10 MED ORDER — DIPHENHYDRAMINE HCL 50 MG/ML IJ SOLN
INTRAMUSCULAR | Status: DC | PRN
  Administered 2022-09-10: 12.5 mg via INTRAVENOUS

## 2022-09-10 MED ORDER — FENTANYL CITRATE (PF) 100 MCG/2ML IJ SOLN: 100.0000 ug | Freq: Once | INTRAMUSCULAR | Status: AC

## 2022-09-10 MED ORDER — FENTANYL CITRATE (PF) 100 MCG/2ML IJ SOLN
INTRAMUSCULAR | Status: AC
  Administered 2022-09-10: 100 ug via INTRAVENOUS
  Filled 2022-09-10: qty 2

## 2022-09-10 MED ORDER — ONDANSETRON HCL 4 MG PO TABS: 4.0000 mg | ORAL_TABLET | Freq: Three times a day (TID) | ORAL | 0 refills | Status: AC | PRN

## 2022-09-10 MED ORDER — LACTATED RINGERS IV SOLN: INTRAVENOUS | Status: DC

## 2022-09-10 MED ORDER — AMISULPRIDE (ANTIEMETIC) 5 MG/2ML IV SOLN: 10.0000 mg | Freq: Once | INTRAVENOUS | Status: DC | PRN

## 2022-09-10 MED ORDER — GLYCOPYRROLATE PF 0.2 MG/ML IJ SOSY
PREFILLED_SYRINGE | INTRAMUSCULAR | Status: DC | PRN
  Administered 2022-09-10: .2 mg via INTRAVENOUS

## 2022-09-10 MED ORDER — ROPIVACAINE HCL 5 MG/ML IJ SOLN
INTRAMUSCULAR | Status: DC | PRN
  Administered 2022-09-10: 30 mL via PERINEURAL

## 2022-09-10 MED ORDER — OXYCODONE HCL 5 MG PO TABS: 5.0000 mg | ORAL_TABLET | ORAL | Status: DC | PRN

## 2022-09-10 MED ORDER — BUPIVACAINE-EPINEPHRINE (PF) 0.25% -1:200000 IJ SOLN
INTRAMUSCULAR | Status: AC
  Filled 2022-09-10: qty 30

## 2022-09-10 MED ORDER — ACETAMINOPHEN 500 MG PO TABS
1000.0000 mg | ORAL_TABLET | Freq: Once | ORAL | Status: AC
  Filled 2022-09-10: qty 2

## 2022-09-10 MED ORDER — BUPIVACAINE-EPINEPHRINE 0.25% -1:200000 IJ SOLN
INTRAMUSCULAR | Status: DC | PRN
  Administered 2022-09-10: 12 mL

## 2022-09-10 SURGICAL SUPPLY — 35 items
APPLIER CLIP 5 13 M/L LIGAMAX5 (MISCELLANEOUS) ×1
BAG COUNTER SPONGE SURGICOUNT (BAG) ×1 IMPLANT
CANISTER SUCT 3000ML PPV (MISCELLANEOUS) ×1 IMPLANT
CHLORAPREP W/TINT 26 (MISCELLANEOUS) ×1 IMPLANT
CLIP APPLIE 5 13 M/L LIGAMAX5 (MISCELLANEOUS) ×1 IMPLANT
COVER SURGICAL LIGHT HANDLE (MISCELLANEOUS) ×1 IMPLANT
DERMABOND ADVANCED .7 DNX12 (GAUZE/BANDAGES/DRESSINGS) ×1 IMPLANT
ELECT REM PT RETURN 9FT ADLT (ELECTROSURGICAL) ×1
ELECTRODE REM PT RTRN 9FT ADLT (ELECTROSURGICAL) ×1 IMPLANT
GLOVE BIO SURGEON STRL SZ7 (GLOVE) ×1 IMPLANT
GLOVE BIOGEL PI IND STRL 7.5 (GLOVE) ×1 IMPLANT
GOWN STRL REUS W/ TWL LRG LVL3 (GOWN DISPOSABLE) ×3 IMPLANT
GOWN STRL REUS W/TWL LRG LVL3 (GOWN DISPOSABLE) ×3
GRASPER SUT TROCAR 14GX15 (MISCELLANEOUS) ×1 IMPLANT
KIT BASIN OR (CUSTOM PROCEDURE TRAY) ×1 IMPLANT
KIT TURNOVER KIT B (KITS) ×1 IMPLANT
NS IRRIG 1000ML POUR BTL (IV SOLUTION) ×1 IMPLANT
PAD ARMBOARD 7.5X6 YLW CONV (MISCELLANEOUS) ×1 IMPLANT
POUCH RETRIEVAL ECOSAC 10 (ENDOMECHANICALS) ×1 IMPLANT
POUCH RETRIEVAL ECOSAC 10MM (ENDOMECHANICALS) ×1
SCISSORS LAP 5X35 DISP (ENDOMECHANICALS) ×1 IMPLANT
SET IRRIG TUBING LAPAROSCOPIC (IRRIGATION / IRRIGATOR) ×1 IMPLANT
SET TUBE SMOKE EVAC HIGH FLOW (TUBING) ×1 IMPLANT
SLEEVE ENDOPATH XCEL 5M (ENDOMECHANICALS) ×2 IMPLANT
SPECIMEN JAR SMALL (MISCELLANEOUS) ×1 IMPLANT
STRIP CLOSURE SKIN 1/2X4 (GAUZE/BANDAGES/DRESSINGS) ×1 IMPLANT
SUT MNCRL AB 4-0 PS2 18 (SUTURE) ×1 IMPLANT
SUT VICRYL 0 UR6 27IN ABS (SUTURE) ×1 IMPLANT
TOWEL GREEN STERILE (TOWEL DISPOSABLE) ×1 IMPLANT
TRAY LAPAROSCOPIC MC (CUSTOM PROCEDURE TRAY) ×1 IMPLANT
TROCAR BALLN 12MMX100 BLUNT (TROCAR) IMPLANT
TROCAR XCEL BLUNT TIP 100MML (ENDOMECHANICALS) ×1 IMPLANT
TROCAR Z-THREAD OPTICAL 5X100M (TROCAR) ×1 IMPLANT
WARMER LAPAROSCOPE (MISCELLANEOUS) IMPLANT
WATER STERILE IRR 1000ML POUR (IV SOLUTION) ×1 IMPLANT

## 2022-09-10 NOTE — Op Note (Signed)
Preoperative diagnosis: Biliary colic, gallbladder polyps Postoperative diagnosis: Same as above Procedure: Laparoscopic cholecystectomy Surgeon: Dr Serita Grammes Anesthesia: General with bilateral tap blocks Estimated blood loss: Minimal Complications: None Drains: None Specimens: Gallbladder and contents to pathology Disposition to recovery stable condition  Indications: This 76 year old female who has had prior TRAM flap with mesh implanted.  I know her from treatment for breast cancer.  She had an ultrasound if she had epigastric pain that showed a 12 mm nonmobile nonshadowing focus in the gallbladder.  She had a previous ultrasound in 2017 that this is now increased in size and is irregular.  I saw her we discussed a cholecystectomy due to her symptoms as well as enlargement of this area.  Procedure: After informed consent was obtained she was taken to the operating room.  She had undergone bilateral tap blocks.  She was injected with ICG dye.  She was given antibiotics.  SCDs were placed.  She was placed under general anesthesia without complication.  She was prepped and draped in the standard sterile surgical fashion.  Surgical timeout was then performed.    I made a vertical incision below her umbilicus and carried this down to the fascia.  She had what appeared to be Marlex mesh present.  I incised this in the fascia and entered the abdomen bluntly.  There is no evidence of an entry injury.  I placed a 0 Vicryl pursestring suture to the fascia inserted a Hasson trocar.  Abdomen was insufflated 15 mmHg pressure.  The omentum and duodenum were adherent to the gallbladder.  These were removed with blunt dissection.  Gallbladder was then retracted cephalad and lateral.  I was able to dissect in the triangle and obtain a critical view of safety.  This was confirmed by the ICG dye.  I then clipped the cystic artery 3 times and divided leaving 2 clips in place.  I treated the duct in the similar  fashion.  The clips completely traverse the duct and the duct was viable.  I then remove the gallbladder from the liver bed.  There was a small posterior branch of the artery that bled that I clipped 3 times and that was hemostatic.  I then remove the gallbladder from the liver bed and placed in a retrieval bag.  Hemostasis was obtained.  I removed the gallbladder and its contents with the retrieval bag.  Hassan trocar was removed.  I tied the pursestring down.  I placed several additional 0 Vicryl sutures to completely obliterate that defect.  I then remove the remaining trocars and desufflated the abdomen.  These were closed with 4-0 Monocryl and glue.  She tolerated this well was extubated and transferred recovery stable.

## 2022-09-10 NOTE — Anesthesia Preprocedure Evaluation (Addendum)
Anesthesia Evaluation  Patient identified by MRN, date of birth, ID band Patient awake    Reviewed: Allergy & Precautions, NPO status , Patient's Chart, lab work & pertinent test results  History of Anesthesia Complications (+) PONV and history of anesthetic complications  Airway Mallampati: III  TM Distance: >3 FB Neck ROM: Limited   Comment: Somewhat limited neck extension s/p fusion Dental  (+) Teeth Intact, Dental Advisory Given   Pulmonary former smoker Quit smoking 1979   Pulmonary exam normal breath sounds clear to auscultation       Cardiovascular hypertension (159/82 in preop, per pt normally 140s SBP), Pt. on medications Normal cardiovascular exam Rhythm:Regular Rate:Normal     Neuro/Psych  PSYCHIATRIC DISORDERS Anxiety Depression       GI/Hepatic Neg liver ROS,GERD  Medicated and Controlled,,  Endo/Other  negative endocrine ROS    Renal/GU negative Renal ROS  negative genitourinary   Musculoskeletal  (+) Arthritis , Osteoarthritis,    Abdominal   Peds  Hematology negative hematology ROS (+) Hb 13.5   Anesthesia Other Findings   Reproductive/Obstetrics negative OB ROS                             Anesthesia Physical Anesthesia Plan  ASA: 2  Anesthesia Plan: General and Regional   Post-op Pain Management: Tylenol PO (pre-op)* and Regional block*   Induction: Intravenous  PONV Risk Score and Plan: 4 or greater and Ondansetron, Dexamethasone, Midazolam, Treatment may vary due to age or medical condition, Diphenhydramine and Metaclopromide  Airway Management Planned: Oral ETT and Video Laryngoscope Planned  Additional Equipment: None  Intra-op Plan:   Post-operative Plan: Extubation in OR  Informed Consent: I have reviewed the patients History and Physical, chart, labs and discussed the procedure including the risks, benefits and alternatives for the proposed anesthesia  with the patient or authorized representative who has indicated his/her understanding and acceptance.     Dental advisory given  Plan Discussed with: CRNA  Anesthesia Plan Comments: (Last airway for neck surgery at [facility]: Blade: Glidescope Blade size: #3 ETT size (mm): 7.0  )       Anesthesia Quick Evaluation

## 2022-09-10 NOTE — Discharge Instructions (Signed)
[facility] LAPAROSCOPIC SURGERY: POST OP INSTRUCTIONS  Always review your discharge instruction sheet given to you by the facility where your surgery was performed. IF YOU HAVE DISABILITY OR FAMILY LEAVE FORMS, YOU MUST BRING THEM TO THE OFFICE FOR PROCESSING.   DO NOT GIVE THEM TO YOUR DOCTOR.  A prescription for pain medication may be given to you upon discharge.  Take your pain medication as prescribed, if needed.  If narcotic pain medicine is not needed, then you may take acetaminophen (Tylenol), naprosyn (Alleve), or ibuprofen (Advil) as needed. Take your usually prescribed medications unless otherwise directed. If you need a refill on your pain medication, please contact your pharmacy.  They will contact our office to request authorization. Prescriptions will not be filled after 5pm or on week-ends. You should follow a light diet the first few days after arrival home, such as soup and crackers, etc.  Be sure to include lots of fluids daily. Most patients will experience some swelling and bruising in the area of the incisions.  Ice packs will help.  Swelling and bruising can take several days to resolve.  It is common to experience some constipation if taking pain medication after surgery.  Increasing fluid intake and taking a stool softener (such as Colace) will usually help or prevent this problem from occurring.  A mild laxative (Milk of Magnesia or Miralax) should be taken according to package instructions if there are no bowel movements after 48 hours. Unless discharge instructions indicate otherwise, you may remove your bandages 48 hours after surgery, and you may shower at that time.  You may have steri-strips (small skin tapes) in place directly over the incision.  These strips should be left on the skin for 7-10 days.  If your surgeon used skin glue on the incision, you may shower in 24 hours.  The glue will flake off over the next  2-3 weeks.  Any sutures or staples will be removed at the office during your follow-up visit. ACTIVITIES:  You may resume regular (light) daily activities beginning the next day--such as daily self-care, walking, climbing stairs--gradually increasing activities as tolerated.  You may have sexual intercourse when it is comfortable.  Refrain from any heavy lifting or straining until approved by your doctor. You may drive when you are no longer taking prescription pain medication, you can comfortably wear a seatbelt, and you can safely maneuver your car and apply brakes. RETURN TO WORK:  __________________________________________________________ You should see your doctor in the office for a follow-up appointment approximately 2-3 weeks after your surgery.  Make sure that you call for this appointment within a day or two after you arrive home to insure a convenient appointment time. OTHER INSTRUCTIONS: __________________________________________________________________________________________________________________________ __________________________________________________________________________________________________________________________ WHEN TO CALL YOUR DOCTOR: Fever over 101.0 Inability to urinate Continued bleeding from incision. Increased pain, redness, or drainage from the incision. Increasing abdominal pain  The clinic staff is available to answer your questions during regular business hours.  Please don't hesitate to call and ask to speak to one of the nurses for clinical concerns.  If you have a medical emergency, go to the nearest emergency room or call 911.  A surgeon from [facility] is always on call at the hospital. [address], [address], [place], [place]  [address] ? [address] [address], [place], [place]   [address] [phone] ? [phone] ? FAX [phone] Web site: [email or web address]  

## 2022-09-10 NOTE — Anesthesia Procedure Notes (Signed)
    Anesthesia Regional Block: TAP block   Pre-Anesthetic Checklist: , timeout performed,  Correct Patient, Correct Site, Correct Laterality,  Correct Procedure, Correct Position, site marked,  Risks and benefits discussed,  Surgical consent,  Pre-op evaluation,  At surgeon's request and post-op pain management  Laterality: Left and Right  Prep: Maximum Sterile Barrier Precautions used, chloraprep       Needles:  Injection technique: Single-shot  Needle Type: Echogenic Stimulator Needle     Needle Length: 9cm  Needle Gauge: 22     Additional Needles:   Procedures:,,,, ultrasound used (permanent image in chart),,    Narrative:  Start time: 09/10/2022 12:15 PM End time: 09/10/2022 12:20 PM Injection made incrementally with aspirations every 5 mL.  Performed by: Personally  Anesthesiologist: Pervis Hocking, DO  Additional Notes: Monitors applied. No increased pain on injection. No increased resistance to injection. Injection made in 5cc increments. Good needle visualization. Patient tolerated procedure well.

## 2022-09-10 NOTE — Transfer of Care (Signed)
Immediate Anesthesia Transfer of Care Note  Patient: Christina Knight  Procedure(s) Performed: LAPAROSCOPIC CHOLECYSTECTOMY WITH ICG DYE (Abdomen) INDOCYANINE GREEN FLUORESCENCE IMAGING (ICG) (Abdomen)  Patient Location: PACU  Anesthesia Type:General  Level of Consciousness: drowsy  Airway & Oxygen Therapy: Patient Spontanous Breathing and Patient connected to face mask oxygen  Post-op Assessment: Report given to RN and Post -op Vital signs reviewed and stable  Post vital signs: Reviewed and stable  Last Vitals:  Vitals Value Taken Time  BP 129/71 09/10/22 1427  Temp    Pulse 73 09/10/22 1430  Resp 14 09/10/22 1430  SpO2 95 % 09/10/22 1430  Vitals shown include unvalidated device data.  Last Pain:  Vitals:   09/10/22 1127  TempSrc:   PainSc: 0-No pain      Patients Stated Pain Goal: 0 (71/21/97 5883)  Complications: No notable events documented.

## 2022-09-10 NOTE — H&P (Signed)
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 This was treated with surgery and a TRAM flap previously. She has been doing fine from this. She comes in today with epigastric pain that has been occurring for several weeks. This is not going away. She does have a history of constipation which is stable. This is a dull pain that is present most of the time but intermittently it is worse that she thinks may be related to food. There is no nausea and vomiting associated with this. No real change in her bowel habits associated with this. She has undergone an ultrasound that shows a 12 mm nonmobile nonshadowing focus in the gallbladder. She had a previous ultrasound in 2017 that this is slightly increased in size and is now slightly irregular. She has a second small 4 mm polyp that is noticed as well. Her common bile duct is normal. The liver appears normal and the pancreas all appears normal as well. She is here today to discuss her options. She has a prior TRAM flap with mesh that was used to reinforce this as well as a diagnostic laparoscopy in 2014 Review of Systems: A complete review of systems was obtained from the patient. I have reviewed this information and discussed as appropriate with the patient. See HPI as well for other ROS.  Review of Systems  Gastrointestinal: Positive for abdominal pain.  All other systems reviewed and are negative.   Medical History: Past Medical History:  Diagnosis Date  Allergic rhinitis  Anxiety  Breast cancer (CMS-HCC) 1996  Left  Compression fracture of C-spine (CMS-HCC)  T7, T12, L1 (old)  Depression 1975  Diverticulitis large intestine  DVT (deep venous thrombosis) (CMS-HCC) 1965  pateint stated in leg  Foot fracture  GERD (gastroesophageal reflux disease) 2001  Hyperlipidemia 1997  Hypertension  Microscopic hematuria 2005  Negative wrkp  Mixed incontinence 10/04/2012  Motion sickness  Nontraumatic  complete tear of left rotator cuff 02/22/2018  Osteoarthritis of carpometacarpal joints of thumbs, bilateral 04/09/2016  Osteopenia 2018  Based on 2018 DEXA. Osteoporosis in the past.  PONV (postoperative nausea and vomiting)  Primary osteoarthritis of first carpometacarpal joint of right hand 11/17/2017  Right carpal tunnel syndrome 04/09/2016  SBO (small bowel obstruction) (CMS-HCC) 2013  s/p ex-lap  Toe deformity, acquired, left 06/28/2018  Tubular adenoma of colon  Repeat colonoscopy Q5 yr.  Vitiligo  Followed by [facility].   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  Myofascial muscle pain  Fusion of spine of cervical region  Ganglion and cyst of synovium, tendon and bursa   Past Surgical History:  Procedure Laterality Date  HYSTERECTOMY 1995  w/ Bladder sling  MASTECTOMY Left 1996  with a Tram flap reconstruction in 1997  COLONOSCOPY 2008  COLONOSCOPY 2013  LAPAROSCOPY DIAGNOSTIC N/A 08/27/2012  Procedure: LAPAROSCOPY DIAGNOSTIC; Surgeon: Lacy Duverney, MD; Location: [facility]; Service: General Surgery; Laterality: N/A;  LAPAROSCOPIC LYSIS ADHESIONS N/A 08/27/2012  Procedure: LAPAROSCOPIC LYSIS ADHESIONS; Surgeon: Lacy Duverney, MD; Location: [facility]; Service: General Surgery; Laterality: N/A;  COLONOSCOPY 2018  Tubular adenoma. Repeat 5 yr.  ARTHROSCOPIC SUBACROMIAL DECOMP Left 09/27/2018  Procedure: ARTHROSCOPY,  SHOULDER, DECOMPRESSION SUBACROMIAL SPACE W/PARTIAL ACROMIOPLASTY, W/CORACOACROMIAL LIGAMENT RELEASE; Surgeon: Alfonso Patten, MD; Location: [facility]; Service:  Orthopedics; Laterality: Left;  ARTHROSCOPIC ROTATOR CUFF REPAIR Left 09/27/2018  Procedure: ARTHROSCOPY, SHOULDER, ROTATOR CUFF REPAIR; Surgeon: Alfonso Patten, MD; Location: [facility]; Service: Orthopedics; Laterality: Left;  ARTHRODESIS ANTERIOR CERVICLE SPINE N/A 03/05/2021  Procedure: Anterior Cervical Discectomy and fusion C3-4, C4-5, C5-6 with Medtronic Cornerstone LS allograft and Medtronic Atlantis plating; Surgeon: Redge Gainer, MD; Location: [place]; Service: Neurosurgery; Laterality: N/A;  ARTHRODESIS ANTERIOR CERVICLE SPINE N/A 03/05/2021  Procedure: ARTHRODESIS ANT INTERBODY INC DISCECTOMY, CERVICAL ADDL 17408 X 2; Surgeon: Redge Gainer, MD; Location: [place]; Service: Neurosurgery; Laterality: N/A;  INSTRUMENTATION ANTERIOR SPINE 8/MORE SEGMENTS N/A 03/05/2021  Procedure: ANTERIOR INSTRUMENTATION; 4 TO 7 VERTEBRAL SEGMENTS (LIST IN ADDITION TO PRIMARY PROCEDURE); Surgeon: Redge Gainer, MD; Location: [place]; Service: Neurosurgery; Laterality: N/A;  INSERTION STRUCTURAL BONE ALLOGRAFT FOR SPINE SURGERY N/A 03/05/2021  Procedure: INSERTION STRUCTURAL BONE ALLOGRAFT FOR SPINE SURGERY; Surgeon: Redge Gainer, MD; Location: [place]; Service: Neurosurgery; Laterality: N/A;  BLADDER SURGERY  EYE SURGERY  FREE MUSCLE FLAP ABDOMEN  FREE SKIN FLAP ABDOMEN  REPAIR COMPLEX RETINAL DETACHMENT    Allergies  Allergen Reactions  Adhesive Rash  Metoprolol Rash  Petechia in legs   Current Outpatient Medications on File Prior to Visit  Medication Sig Dispense Refill  buPROPion (WELLBUTRIN XL) 300 MG XL tablet Take 300 mg by mouth daily.  carvediloL (COREG) 12.5 MG tablet Take 12.5 mg by mouth 2 (two) times daily with meals  escitalopram oxalate (LEXAPRO) 10 MG tablet Take 10 mg by mouth once daily  estradiol (ESTRACE) 0.01 % (0.1 mg/gram) vaginal cream Place 1 g vaginally twice a week  omeprazole (PRILOSEC) 40 MG DR capsule Take 40 mg by mouth once daily   polyethylene glycol (MIRALAX) packet Take 17 g by mouth nightly Mix in 4-8ounces of fluid prior to taking.  rosuvastatin (CRESTOR) 20 MG tablet Take 20 mg by mouth once daily  solifenacin (VESICARE) 5 MG tablet Take 5 mg by mouth once daily  valsartan (DIOVAN) 40 MG tablet Take 40 mg by mouth once daily  acyclovir (ZOVIRAX) 5 % ointment Apply topically  ergocalciferol, vitamin D2, 50,000 unit capsule Take 50,000 Units by mouth once a week Sunday  FLOWFLEX COVID-19 AG HOME TEST Kit TEST AS DIRECTED TODAY  hydroCHLOROthiazide (MICROZIDE) 12.5 mg capsule Take 12.5 mg by mouth every morning  methocarbamoL (ROBAXIN) 500 MG tablet Take 1 tablet (500 mg total) by mouth 3 (three) times daily as needed 90 tablet 2  methocarbamoL (ROBAXIN) 750 MG tablet Take 1 tablet (750 mg total) by mouth 3 (three) times daily as needed (for muscle spasms) 60 tablet 0  methylPREDNISolone (MEDROL DOSEPACK) 4 mg tablet Follow package directions **Recommended if patient develops difficulty swallowing** 21 tablet 0   No current facility-administered medications on file prior to visit.   Family History  Problem Relation Age of Onset  Colon cancer Father  Depression Father  Breast cancer Maternal Aunt  Heart disease Maternal Grandfather  Hyperlipidemia (Elevated cholesterol) Maternal Grandfather  Breast cancer Paternal Grandmother  High blood pressure (Hypertension) Paternal Grandfather  Osteoarthritis Maternal Grandmother  Heart failure Mother  Dementia Mother  Hyperlipidemia (Elevated cholesterol) Sister  Anuerysm Brother  Colon polyps Brother  Hyperlipidemia (Elevated cholesterol) Brother  Hyperlipidemia (Elevated cholesterol) Sister  Hyperlipidemia (Elevated cholesterol) Sister  High blood pressure (Hypertension) Sister  Colon polyps Sister  Colon polyps Son    Social History  Tobacco Use  Smoking Status Former  Years: 1  Types: Cigarettes  Quit date: 10/05/1967  Years since quitting: 54.8   Smokeless Tobacco Never  Marital status: Widowed  Number of children: 2  Highest education level: Master's degree (e.g., MA, MS, MEng, MEd, MSW, MBA)  Occupational History  Occupation: Retired  Tobacco Use  Smoking status: Former  Years: 1  Types: Cigarettes  Quit date: 10/05/1967  Years since quitting: 54.8  Smokeless tobacco: Never  Vaping Use  Vaping Use: Never used  Substance and Sexual Activity  Alcohol use: Yes  Types: 4 Glasses of wine per week  Drug use: No  Sexual activity: Not Currently  Partners: Male   Social Determinants of Health   Transportation Needs: No Transportation Needs (03/06/2021)  PRAPARE - Transportation  Lack of Transportation (Medical): No  Lack of Transportation (Non-Medical): No   Objective:   Vitals:  08/05/22 0950  BP: 124/72  Pulse: 62  Temp: 36.8 C (98.3 F)  SpO2: 98%  Weight: 60.1 kg (132 lb 9.6 oz)  Height: 163.8 cm (5' 4.5")   Body mass index is 22.41 kg/m.  Physical Exam Vitals reviewed.  Constitutional:  Appearance: Normal appearance.  Eyes:  General: No scleral icterus. Abdominal:  General: There is no distension.  Palpations: Abdomen is soft.  Tenderness: There is abdominal tenderness (epigastrium).  Comments: Well healed scars  Neurological:  Mental Status: She is alert.   Assessment and Plan:   Gallbladder polyp  Laparoscopic cholecystectomy  It is possible that her symptoms are related to her gallbladder. However I am not 100% sure of this. Due to the fact that she has what looks to be a polyp that is now irregular and increased in size with the possibility of having symptoms related to her gallbladder I do think she merits a laparoscopic cholecystectomy. I do not think any additional testing is needed before surgery. We did discuss that this might not prevent all of the pain she is currently having. I discussed the procedure in detail. We discussed the risks and benefits of a laparoscopic cholecystectomy  and possible cholangiogram including, but not limited to bleeding, infection, injury to surrounding structures such as the intestine or liver, bile leak, retained gallstones, need to convert to an open procedure, prolonged diarrhea, blood clots such as DVT, common bile duct injury, anesthesia risks, and possible need for additional procedures. The likelihood of improvement in symptoms and return to the patient's normal status is good. We discussed the typical post-operative recovery course.

## 2022-09-10 NOTE — Interval H&P Note (Signed)
History and Physical Interval Note:  09/10/2022 12:53 PM  Christina Knight  has presented today for surgery, with the diagnosis of GALLBLADDER POLYPS.  The various methods of treatment have been discussed with the patient and family. After consideration of risks, benefits and other options for treatment, the patient has consented to  Procedure(s) with comments: LAPAROSCOPIC CHOLECYSTECTOMY WITH ICG DYE (N/A) - GEN AND TAP BLOCK INDOCYANINE GREEN FLUORESCENCE IMAGING (ICG) (N/A) as a surgical intervention.  The patient's history has been reviewed, patient examined, no change in status, stable for surgery.  I have reviewed the patient's chart and labs.  Questions were answered to the patient's satisfaction.     Rolm Bookbinder

## 2022-09-10 NOTE — Anesthesia Procedure Notes (Signed)
Procedure Name: Intubation Date/Time: 09/10/2022 1:23 PM  Performed by: Carolan Clines, CRNAPre-anesthesia Checklist: Patient identified, Emergency Drugs available, Suction available and Patient being monitored Patient Re-evaluated:Patient Re-evaluated prior to induction Oxygen Delivery Method: Circle system utilized Preoxygenation: Pre-oxygenation with 100% oxygen Induction Type: IV induction Ventilation: Mask ventilation without difficulty Laryngoscope Size: Glidescope and 3 Tube type: Oral Tube size: 7.0 mm Number of attempts: 1 Airway Equipment and Method: Stylet and Oral airway Placement Confirmation: ETT inserted through vocal cords under direct vision, positive ETCO2 and breath sounds checked- equal and bilateral Secured at: 22 cm Tube secured with: Tape Dental Injury: Teeth and Oropharynx as per pre-operative assessment

## 2022-09-11 ENCOUNTER — Encounter (HOSPITAL_COMMUNITY): Payer: Self-pay | Admitting: General Surgery

## 2022-09-11 NOTE — Anesthesia Postprocedure Evaluation (Signed)
Anesthesia Post Note  Patient: Christina Knight  Procedure(s) Performed: LAPAROSCOPIC CHOLECYSTECTOMY WITH ICG DYE (Abdomen) INDOCYANINE GREEN FLUORESCENCE IMAGING (ICG) (Abdomen)     Patient location during evaluation: PACU Anesthesia Type: Regional and General Level of consciousness: awake and alert Pain management: pain level controlled Vital Signs Assessment: post-procedure vital signs reviewed and stable Respiratory status: spontaneous breathing, nonlabored ventilation, respiratory function stable and patient connected to nasal cannula oxygen Cardiovascular status: blood pressure returned to baseline and stable Postop Assessment: no apparent nausea or vomiting Anesthetic complications: no   No notable events documented.  Last Vitals:  Vitals:   09/10/22 1445 09/10/22 1500  BP: (!) 170/88 (!) 155/88  Pulse: 68 71  Resp: 14 12  Temp:  36.8 C  SpO2: 100% 92%    Last Pain:  Vitals:   09/10/22 1500  TempSrc:   PainSc: 0-No pain                 Rivaan Kendall S

## 2022-09-19 LAB — SURGICAL PATHOLOGY

## 2022-09-22 DIAGNOSIS — R42 Dizziness and giddiness: Secondary | ICD-10-CM | POA: Diagnosis not present

## 2022-09-22 DIAGNOSIS — M542 Cervicalgia: Secondary | ICD-10-CM | POA: Diagnosis not present

## 2022-09-22 DIAGNOSIS — R2681 Unsteadiness on feet: Secondary | ICD-10-CM | POA: Diagnosis not present

## 2022-09-25 ENCOUNTER — Encounter: Payer: Self-pay | Admitting: General Surgery

## 2022-09-29 DIAGNOSIS — M542 Cervicalgia: Secondary | ICD-10-CM | POA: Diagnosis not present

## 2022-09-30 DIAGNOSIS — R42 Dizziness and giddiness: Secondary | ICD-10-CM | POA: Diagnosis not present

## 2022-09-30 DIAGNOSIS — M542 Cervicalgia: Secondary | ICD-10-CM | POA: Diagnosis not present

## 2022-09-30 DIAGNOSIS — R2681 Unsteadiness on feet: Secondary | ICD-10-CM | POA: Diagnosis not present

## 2022-10-03 ENCOUNTER — Other Ambulatory Visit: Payer: Self-pay | Admitting: General Surgery

## 2022-10-03 DIAGNOSIS — Z1231 Encounter for screening mammogram for malignant neoplasm of breast: Secondary | ICD-10-CM

## 2022-10-14 ENCOUNTER — Other Ambulatory Visit: Payer: Self-pay | Admitting: General Surgery

## 2022-10-14 DIAGNOSIS — N6310 Unspecified lump in the right breast, unspecified quadrant: Secondary | ICD-10-CM

## 2022-10-14 DIAGNOSIS — N6314 Unspecified lump in the right breast, lower inner quadrant: Secondary | ICD-10-CM | POA: Diagnosis not present

## 2022-10-15 ENCOUNTER — Ambulatory Visit
Admission: RE | Admit: 2022-10-15 | Discharge: 2022-10-15 | Disposition: A | Discharge status: Home / Self Care | Payer: TRICARE For Life (TFL) | Source: Ambulatory Visit | Attending: General Surgery | Admitting: General Surgery

## 2022-10-15 DIAGNOSIS — R928 Other abnormal and inconclusive findings on diagnostic imaging of breast: Secondary | ICD-10-CM | POA: Diagnosis not present

## 2022-10-15 DIAGNOSIS — N6489 Other specified disorders of breast: Secondary | ICD-10-CM | POA: Diagnosis not present

## 2022-10-15 DIAGNOSIS — M542 Cervicalgia: Secondary | ICD-10-CM | POA: Diagnosis not present

## 2022-10-15 DIAGNOSIS — R42 Dizziness and giddiness: Secondary | ICD-10-CM | POA: Diagnosis not present

## 2022-10-15 DIAGNOSIS — R2681 Unsteadiness on feet: Secondary | ICD-10-CM | POA: Diagnosis not present

## 2022-10-15 DIAGNOSIS — N6310 Unspecified lump in the right breast, unspecified quadrant: Secondary | ICD-10-CM

## 2022-10-16 DIAGNOSIS — M6281 Muscle weakness (generalized): Secondary | ICD-10-CM | POA: Diagnosis not present

## 2022-10-21 ENCOUNTER — Other Ambulatory Visit: Payer: TRICARE For Life (TFL)

## 2022-10-21 DIAGNOSIS — N39498 Other specified urinary incontinence: Secondary | ICD-10-CM | POA: Diagnosis not present

## 2022-10-21 DIAGNOSIS — N393 Stress incontinence (female) (male): Secondary | ICD-10-CM | POA: Diagnosis not present

## 2022-10-21 DIAGNOSIS — N3281 Overactive bladder: Secondary | ICD-10-CM | POA: Diagnosis not present

## 2022-10-28 DIAGNOSIS — M810 Age-related osteoporosis without current pathological fracture: Secondary | ICD-10-CM | POA: Diagnosis not present

## 2022-10-28 DIAGNOSIS — K219 Gastro-esophageal reflux disease without esophagitis: Secondary | ICD-10-CM | POA: Diagnosis not present

## 2022-10-28 DIAGNOSIS — F322 Major depressive disorder, single episode, severe without psychotic features: Secondary | ICD-10-CM | POA: Diagnosis not present

## 2022-10-28 DIAGNOSIS — I10 Essential (primary) hypertension: Secondary | ICD-10-CM | POA: Diagnosis not present

## 2022-10-28 DIAGNOSIS — E7800 Pure hypercholesterolemia, unspecified: Secondary | ICD-10-CM | POA: Diagnosis not present

## 2022-10-28 DIAGNOSIS — Z0000 Encounter for general adult medical examination without abnormal findings: Secondary | ICD-10-CM | POA: Diagnosis not present

## 2022-10-28 DIAGNOSIS — Z23 Encounter for immunization: Secondary | ICD-10-CM | POA: Diagnosis not present

## 2022-10-28 DIAGNOSIS — E559 Vitamin D deficiency, unspecified: Secondary | ICD-10-CM | POA: Diagnosis not present

## 2022-10-28 DIAGNOSIS — Z86010 Personal history of colonic polyps: Secondary | ICD-10-CM | POA: Diagnosis not present

## 2022-10-31 DIAGNOSIS — M6281 Muscle weakness (generalized): Secondary | ICD-10-CM | POA: Diagnosis not present

## 2022-11-05 DIAGNOSIS — R42 Dizziness and giddiness: Secondary | ICD-10-CM | POA: Diagnosis not present

## 2022-11-05 DIAGNOSIS — M542 Cervicalgia: Secondary | ICD-10-CM | POA: Diagnosis not present

## 2022-11-05 DIAGNOSIS — R2681 Unsteadiness on feet: Secondary | ICD-10-CM | POA: Diagnosis not present

## 2022-11-24 ENCOUNTER — Ambulatory Visit: Payer: TRICARE For Life (TFL)

## 2022-11-30 DIAGNOSIS — I10 Essential (primary) hypertension: Secondary | ICD-10-CM | POA: Diagnosis not present

## 2022-11-30 DIAGNOSIS — R079 Chest pain, unspecified: Secondary | ICD-10-CM | POA: Diagnosis not present

## 2022-11-30 DIAGNOSIS — R0789 Other chest pain: Secondary | ICD-10-CM | POA: Diagnosis not present

## 2022-11-30 DIAGNOSIS — E785 Hyperlipidemia, unspecified: Secondary | ICD-10-CM | POA: Diagnosis not present

## 2022-11-30 DIAGNOSIS — M47814 Spondylosis without myelopathy or radiculopathy, thoracic region: Secondary | ICD-10-CM | POA: Diagnosis not present

## 2022-11-30 DIAGNOSIS — M4854XA Collapsed vertebra, not elsewhere classified, thoracic region, initial encounter for fracture: Secondary | ICD-10-CM | POA: Diagnosis not present

## 2022-11-30 DIAGNOSIS — E871 Hypo-osmolality and hyponatremia: Secondary | ICD-10-CM | POA: Diagnosis not present

## 2022-11-30 DIAGNOSIS — M79602 Pain in left arm: Secondary | ICD-10-CM | POA: Diagnosis not present

## 2022-11-30 DIAGNOSIS — I700 Atherosclerosis of aorta: Secondary | ICD-10-CM | POA: Diagnosis not present

## 2022-11-30 DIAGNOSIS — R001 Bradycardia, unspecified: Secondary | ICD-10-CM | POA: Diagnosis not present

## 2022-12-05 DIAGNOSIS — R0789 Other chest pain: Secondary | ICD-10-CM | POA: Diagnosis not present

## 2022-12-05 DIAGNOSIS — R0609 Other forms of dyspnea: Secondary | ICD-10-CM | POA: Diagnosis not present

## 2022-12-05 DIAGNOSIS — R931 Abnormal findings on diagnostic imaging of heart and coronary circulation: Secondary | ICD-10-CM | POA: Diagnosis not present

## 2022-12-05 DIAGNOSIS — R350 Frequency of micturition: Secondary | ICD-10-CM | POA: Diagnosis not present

## 2022-12-11 DIAGNOSIS — H43812 Vitreous degeneration, left eye: Secondary | ICD-10-CM | POA: Diagnosis not present

## 2022-12-11 DIAGNOSIS — Z0100 Encounter for examination of eyes and vision without abnormal findings: Secondary | ICD-10-CM | POA: Diagnosis not present

## 2022-12-11 DIAGNOSIS — M3501 Sicca syndrome with keratoconjunctivitis: Secondary | ICD-10-CM | POA: Diagnosis not present

## 2022-12-11 DIAGNOSIS — M6281 Muscle weakness (generalized): Secondary | ICD-10-CM | POA: Diagnosis not present

## 2022-12-11 DIAGNOSIS — H2511 Age-related nuclear cataract, right eye: Secondary | ICD-10-CM | POA: Diagnosis not present

## 2022-12-11 DIAGNOSIS — H35362 Drusen (degenerative) of macula, left eye: Secondary | ICD-10-CM | POA: Diagnosis not present

## 2022-12-15 DIAGNOSIS — R079 Chest pain, unspecified: Secondary | ICD-10-CM | POA: Diagnosis not present

## 2022-12-15 DIAGNOSIS — I10 Essential (primary) hypertension: Secondary | ICD-10-CM | POA: Diagnosis not present

## 2022-12-15 DIAGNOSIS — R8290 Unspecified abnormal findings in urine: Secondary | ICD-10-CM | POA: Diagnosis not present

## 2022-12-16 DIAGNOSIS — Z86718 Personal history of other venous thrombosis and embolism: Secondary | ICD-10-CM | POA: Diagnosis not present

## 2022-12-16 DIAGNOSIS — Z92241 Personal history of systemic steroid therapy: Secondary | ICD-10-CM | POA: Diagnosis not present

## 2022-12-16 DIAGNOSIS — Z8781 Personal history of (healed) traumatic fracture: Secondary | ICD-10-CM | POA: Diagnosis not present

## 2022-12-16 DIAGNOSIS — M810 Age-related osteoporosis without current pathological fracture: Secondary | ICD-10-CM | POA: Diagnosis not present

## 2022-12-16 DIAGNOSIS — K219 Gastro-esophageal reflux disease without esophagitis: Secondary | ICD-10-CM | POA: Diagnosis not present

## 2022-12-24 ENCOUNTER — Other Ambulatory Visit: Payer: Self-pay

## 2022-12-24 ENCOUNTER — Telehealth: Payer: Self-pay | Admitting: Pharmacy Technician

## 2022-12-24 NOTE — Telephone Encounter (Signed)
Dr. Buddy Duty, Juluis Rainier note:  Auth Submission: NO AUTH NEEDED Payer: [place] Medication & CPT/J Code(s) submitted: Reclast (Zolendronic acid) [id number] Route of submission (phone, fax, portal):  Phone # Fax # Auth type: Buy/Bill Units/visits requested: x1 Reference number:  Approval from: 12/24/22 to 10/13/23   Patient will be scheduled as soon as possible

## 2022-12-26 DIAGNOSIS — I3139 Other pericardial effusion (noninflammatory): Secondary | ICD-10-CM | POA: Diagnosis not present

## 2022-12-26 DIAGNOSIS — R0609 Other forms of dyspnea: Secondary | ICD-10-CM | POA: Diagnosis not present

## 2022-12-26 DIAGNOSIS — I25118 Atherosclerotic heart disease of native coronary artery with other forms of angina pectoris: Secondary | ICD-10-CM | POA: Diagnosis not present

## 2022-12-26 DIAGNOSIS — R0789 Other chest pain: Secondary | ICD-10-CM | POA: Diagnosis not present

## 2022-12-29 DIAGNOSIS — M6281 Muscle weakness (generalized): Secondary | ICD-10-CM | POA: Diagnosis not present

## 2023-01-01 ENCOUNTER — Ambulatory Visit (INDEPENDENT_AMBULATORY_CARE_PROVIDER_SITE_OTHER): Payer: Medicare PPO | Admitting: *Deleted

## 2023-01-01 VITALS — BP 135/73 | HR 57 | Temp 98.1°F | Resp 16 | Ht 64.0 in | Wt 130.6 lb

## 2023-01-01 DIAGNOSIS — M810 Age-related osteoporosis without current pathological fracture: Secondary | ICD-10-CM | POA: Diagnosis not present

## 2023-01-01 MED ORDER — DIPHENHYDRAMINE HCL 25 MG PO CAPS: 25.0000 mg | ORAL_CAPSULE | Freq: Once | ORAL | Status: DC

## 2023-01-01 MED ORDER — SODIUM CHLORIDE 0.9 % IV SOLN: INTRAVENOUS | Status: DC

## 2023-01-01 MED ORDER — ZOLEDRONIC ACID 5 MG/100ML IV SOLN
5.0000 mg | Freq: Once | INTRAVENOUS | Status: AC
  Administered 2023-01-01: 5 mg via INTRAVENOUS
  Filled 2023-01-01: qty 100

## 2023-01-01 MED ORDER — ACETAMINOPHEN 325 MG PO TABS
650.0000 mg | ORAL_TABLET | Freq: Once | ORAL | Status: AC
  Administered 2023-01-01: 650 mg via ORAL
  Filled 2023-01-01: qty 2

## 2023-01-01 NOTE — Progress Notes (Signed)
Diagnosis: Osteoporosis  Provider:  Marshell Garfinkel MD  Procedure: Infusion  IV Type: Peripheral, IV Location: R Antecubital  Reclast (Zolendronic Acid), Dose: 5 mg  Infusion Start Time: [id number] am  Infusion Stop Time: 1207 am  Post Infusion IV Care: Observation period completed and Peripheral IV Discontinued  Discharge: Condition: Good, Destination: Home . AVS Provided  Performed by:  Oren Beckmann, RN

## 2023-01-01 NOTE — Patient Instructions (Signed)

## 2023-01-09 DIAGNOSIS — Z86718 Personal history of other venous thrombosis and embolism: Secondary | ICD-10-CM | POA: Diagnosis not present

## 2023-01-09 DIAGNOSIS — Z87891 Personal history of nicotine dependence: Secondary | ICD-10-CM | POA: Diagnosis not present

## 2023-01-09 DIAGNOSIS — E785 Hyperlipidemia, unspecified: Secondary | ICD-10-CM | POA: Diagnosis not present

## 2023-01-09 DIAGNOSIS — R0789 Other chest pain: Secondary | ICD-10-CM | POA: Diagnosis not present

## 2023-01-09 DIAGNOSIS — I10 Essential (primary) hypertension: Secondary | ICD-10-CM | POA: Diagnosis not present

## 2023-01-09 DIAGNOSIS — I2510 Atherosclerotic heart disease of native coronary artery without angina pectoris: Secondary | ICD-10-CM | POA: Diagnosis not present

## 2023-01-19 DIAGNOSIS — R030 Elevated blood-pressure reading, without diagnosis of hypertension: Secondary | ICD-10-CM | POA: Diagnosis not present

## 2023-01-19 DIAGNOSIS — N3000 Acute cystitis without hematuria: Secondary | ICD-10-CM | POA: Diagnosis not present

## 2023-01-30 DIAGNOSIS — N393 Stress incontinence (female) (male): Secondary | ICD-10-CM | POA: Diagnosis not present

## 2023-03-05 DIAGNOSIS — M25511 Pain in right shoulder: Secondary | ICD-10-CM | POA: Diagnosis not present

## 2023-03-05 DIAGNOSIS — M19011 Primary osteoarthritis, right shoulder: Secondary | ICD-10-CM | POA: Diagnosis not present

## 2023-03-05 DIAGNOSIS — M7541 Impingement syndrome of right shoulder: Secondary | ICD-10-CM | POA: Diagnosis not present

## 2023-03-12 DIAGNOSIS — I2510 Atherosclerotic heart disease of native coronary artery without angina pectoris: Secondary | ICD-10-CM | POA: Diagnosis not present

## 2023-03-12 DIAGNOSIS — K219 Gastro-esophageal reflux disease without esophagitis: Secondary | ICD-10-CM | POA: Diagnosis not present

## 2023-03-12 DIAGNOSIS — I10 Essential (primary) hypertension: Secondary | ICD-10-CM | POA: Diagnosis not present

## 2023-03-23 DIAGNOSIS — H353131 Nonexudative age-related macular degeneration, bilateral, early dry stage: Secondary | ICD-10-CM | POA: Diagnosis not present

## 2023-03-23 DIAGNOSIS — H2511 Age-related nuclear cataract, right eye: Secondary | ICD-10-CM | POA: Diagnosis not present

## 2023-03-23 DIAGNOSIS — H35373 Puckering of macula, bilateral: Secondary | ICD-10-CM | POA: Diagnosis not present

## 2023-03-23 DIAGNOSIS — H43811 Vitreous degeneration, right eye: Secondary | ICD-10-CM | POA: Diagnosis not present

## 2023-03-23 DIAGNOSIS — Z8669 Personal history of other diseases of the nervous system and sense organs: Secondary | ICD-10-CM | POA: Diagnosis not present

## 2023-04-21 DIAGNOSIS — Z961 Presence of intraocular lens: Secondary | ICD-10-CM | POA: Diagnosis not present

## 2023-04-21 DIAGNOSIS — H353131 Nonexudative age-related macular degeneration, bilateral, early dry stage: Secondary | ICD-10-CM | POA: Diagnosis not present

## 2023-04-21 DIAGNOSIS — H53412 Scotoma involving central area, left eye: Secondary | ICD-10-CM | POA: Diagnosis not present

## 2023-04-21 DIAGNOSIS — Z8669 Personal history of other diseases of the nervous system and sense organs: Secondary | ICD-10-CM | POA: Diagnosis not present

## 2023-04-21 DIAGNOSIS — H2511 Age-related nuclear cataract, right eye: Secondary | ICD-10-CM | POA: Diagnosis not present

## 2023-04-21 DIAGNOSIS — H5315 Visual distortions of shape and size: Secondary | ICD-10-CM | POA: Diagnosis not present

## 2023-04-21 DIAGNOSIS — Z83511 Family history of glaucoma: Secondary | ICD-10-CM | POA: Diagnosis not present

## 2023-04-21 DIAGNOSIS — H35373 Puckering of macula, bilateral: Secondary | ICD-10-CM | POA: Diagnosis not present

## 2023-05-14 DIAGNOSIS — N3281 Overactive bladder: Secondary | ICD-10-CM | POA: Diagnosis not present

## 2023-06-24 DIAGNOSIS — M6281 Muscle weakness (generalized): Secondary | ICD-10-CM | POA: Diagnosis not present

## 2023-06-24 DIAGNOSIS — M7541 Impingement syndrome of right shoulder: Secondary | ICD-10-CM | POA: Diagnosis not present

## 2023-06-24 DIAGNOSIS — M542 Cervicalgia: Secondary | ICD-10-CM | POA: Diagnosis not present

## 2023-06-24 DIAGNOSIS — M25511 Pain in right shoulder: Secondary | ICD-10-CM | POA: Diagnosis not present

## 2023-06-24 DIAGNOSIS — G8929 Other chronic pain: Secondary | ICD-10-CM | POA: Diagnosis not present

## 2023-07-09 DIAGNOSIS — M6281 Muscle weakness (generalized): Secondary | ICD-10-CM | POA: Diagnosis not present

## 2023-07-09 DIAGNOSIS — G8929 Other chronic pain: Secondary | ICD-10-CM | POA: Diagnosis not present

## 2023-07-09 DIAGNOSIS — M7541 Impingement syndrome of right shoulder: Secondary | ICD-10-CM | POA: Diagnosis not present

## 2023-07-09 DIAGNOSIS — M542 Cervicalgia: Secondary | ICD-10-CM | POA: Diagnosis not present

## 2023-07-09 DIAGNOSIS — M25511 Pain in right shoulder: Secondary | ICD-10-CM | POA: Diagnosis not present

## 2023-07-17 DIAGNOSIS — M542 Cervicalgia: Secondary | ICD-10-CM | POA: Diagnosis not present

## 2023-07-17 DIAGNOSIS — G8929 Other chronic pain: Secondary | ICD-10-CM | POA: Diagnosis not present

## 2023-07-17 DIAGNOSIS — M6281 Muscle weakness (generalized): Secondary | ICD-10-CM | POA: Diagnosis not present

## 2023-07-17 DIAGNOSIS — M25511 Pain in right shoulder: Secondary | ICD-10-CM | POA: Diagnosis not present

## 2023-07-17 DIAGNOSIS — M7541 Impingement syndrome of right shoulder: Secondary | ICD-10-CM | POA: Diagnosis not present

## 2023-07-23 DIAGNOSIS — L649 Androgenic alopecia, unspecified: Secondary | ICD-10-CM | POA: Diagnosis not present

## 2023-07-23 DIAGNOSIS — L80 Vitiligo: Secondary | ICD-10-CM | POA: Diagnosis not present

## 2023-07-24 DIAGNOSIS — M6281 Muscle weakness (generalized): Secondary | ICD-10-CM | POA: Diagnosis not present

## 2023-07-24 DIAGNOSIS — M7541 Impingement syndrome of right shoulder: Secondary | ICD-10-CM | POA: Diagnosis not present

## 2023-07-24 DIAGNOSIS — M25511 Pain in right shoulder: Secondary | ICD-10-CM | POA: Diagnosis not present

## 2023-07-24 DIAGNOSIS — G8929 Other chronic pain: Secondary | ICD-10-CM | POA: Diagnosis not present

## 2023-07-24 DIAGNOSIS — M542 Cervicalgia: Secondary | ICD-10-CM | POA: Diagnosis not present

## 2023-08-06 DIAGNOSIS — M6281 Muscle weakness (generalized): Secondary | ICD-10-CM | POA: Diagnosis not present

## 2023-08-06 DIAGNOSIS — G8929 Other chronic pain: Secondary | ICD-10-CM | POA: Diagnosis not present

## 2023-08-06 DIAGNOSIS — M542 Cervicalgia: Secondary | ICD-10-CM | POA: Diagnosis not present

## 2023-08-06 DIAGNOSIS — M25511 Pain in right shoulder: Secondary | ICD-10-CM | POA: Diagnosis not present

## 2023-08-06 DIAGNOSIS — M7541 Impingement syndrome of right shoulder: Secondary | ICD-10-CM | POA: Diagnosis not present

## 2023-08-27 DIAGNOSIS — I10 Essential (primary) hypertension: Secondary | ICD-10-CM | POA: Diagnosis not present

## 2023-08-27 DIAGNOSIS — I2510 Atherosclerotic heart disease of native coronary artery without angina pectoris: Secondary | ICD-10-CM | POA: Diagnosis not present

## 2023-08-27 DIAGNOSIS — E7800 Pure hypercholesterolemia, unspecified: Secondary | ICD-10-CM | POA: Diagnosis not present

## 2023-09-01 DIAGNOSIS — M6281 Muscle weakness (generalized): Secondary | ICD-10-CM | POA: Diagnosis not present

## 2023-09-01 DIAGNOSIS — M25511 Pain in right shoulder: Secondary | ICD-10-CM | POA: Diagnosis not present

## 2023-09-01 DIAGNOSIS — M542 Cervicalgia: Secondary | ICD-10-CM | POA: Diagnosis not present

## 2023-09-01 DIAGNOSIS — M7541 Impingement syndrome of right shoulder: Secondary | ICD-10-CM | POA: Diagnosis not present

## 2023-09-01 DIAGNOSIS — G8929 Other chronic pain: Secondary | ICD-10-CM | POA: Diagnosis not present

## 2023-10-02 ENCOUNTER — Other Ambulatory Visit: Payer: Self-pay | Admitting: General Surgery

## 2023-10-02 DIAGNOSIS — Z1231 Encounter for screening mammogram for malignant neoplasm of breast: Secondary | ICD-10-CM

## 2023-10-12 ENCOUNTER — Encounter: Payer: Self-pay | Admitting: Internal Medicine

## 2023-10-28 ENCOUNTER — Ambulatory Visit
Admission: RE | Admit: 2023-10-28 | Discharge: 2023-10-28 | Disposition: A | Discharge status: Home / Self Care | Payer: Medicare PPO | Source: Ambulatory Visit | Attending: General Surgery | Admitting: General Surgery

## 2023-10-28 DIAGNOSIS — Z1231 Encounter for screening mammogram for malignant neoplasm of breast: Secondary | ICD-10-CM

## 2023-11-03 DIAGNOSIS — R6889 Other general symptoms and signs: Secondary | ICD-10-CM | POA: Diagnosis not present

## 2023-11-03 DIAGNOSIS — R413 Other amnesia: Secondary | ICD-10-CM | POA: Diagnosis not present

## 2023-11-03 DIAGNOSIS — Z0000 Encounter for general adult medical examination without abnormal findings: Secondary | ICD-10-CM | POA: Diagnosis not present

## 2023-11-03 DIAGNOSIS — E559 Vitamin D deficiency, unspecified: Secondary | ICD-10-CM | POA: Diagnosis not present

## 2023-11-03 DIAGNOSIS — D0512 Intraductal carcinoma in situ of left breast: Secondary | ICD-10-CM | POA: Diagnosis not present

## 2023-11-03 DIAGNOSIS — E7800 Pure hypercholesterolemia, unspecified: Secondary | ICD-10-CM | POA: Diagnosis not present

## 2023-11-03 DIAGNOSIS — K219 Gastro-esophageal reflux disease without esophagitis: Secondary | ICD-10-CM | POA: Diagnosis not present

## 2023-11-03 DIAGNOSIS — I10 Essential (primary) hypertension: Secondary | ICD-10-CM | POA: Diagnosis not present

## 2023-11-03 DIAGNOSIS — N952 Postmenopausal atrophic vaginitis: Secondary | ICD-10-CM | POA: Diagnosis not present

## 2023-11-03 DIAGNOSIS — F3341 Major depressive disorder, recurrent, in partial remission: Secondary | ICD-10-CM | POA: Diagnosis not present

## 2023-12-10 DIAGNOSIS — K219 Gastro-esophageal reflux disease without esophagitis: Secondary | ICD-10-CM | POA: Diagnosis not present

## 2023-12-10 DIAGNOSIS — R1013 Epigastric pain: Secondary | ICD-10-CM | POA: Diagnosis not present

## 2024-01-07 DIAGNOSIS — R7301 Impaired fasting glucose: Secondary | ICD-10-CM | POA: Diagnosis not present

## 2024-01-07 DIAGNOSIS — L659 Nonscarring hair loss, unspecified: Secondary | ICD-10-CM | POA: Diagnosis not present

## 2024-01-07 DIAGNOSIS — K219 Gastro-esophageal reflux disease without esophagitis: Secondary | ICD-10-CM | POA: Diagnosis not present

## 2024-01-07 DIAGNOSIS — Z8781 Personal history of (healed) traumatic fracture: Secondary | ICD-10-CM | POA: Diagnosis not present

## 2024-01-07 DIAGNOSIS — E538 Deficiency of other specified B group vitamins: Secondary | ICD-10-CM | POA: Diagnosis not present

## 2024-01-07 DIAGNOSIS — I10 Essential (primary) hypertension: Secondary | ICD-10-CM | POA: Diagnosis not present

## 2024-01-07 DIAGNOSIS — M810 Age-related osteoporosis without current pathological fracture: Secondary | ICD-10-CM | POA: Diagnosis not present

## 2024-01-07 DIAGNOSIS — Z86718 Personal history of other venous thrombosis and embolism: Secondary | ICD-10-CM | POA: Diagnosis not present

## 2024-01-19 DIAGNOSIS — I10 Essential (primary) hypertension: Secondary | ICD-10-CM | POA: Diagnosis not present

## 2024-01-19 DIAGNOSIS — R7303 Prediabetes: Secondary | ICD-10-CM | POA: Diagnosis not present

## 2024-02-02 DIAGNOSIS — H2511 Age-related nuclear cataract, right eye: Secondary | ICD-10-CM | POA: Diagnosis not present

## 2024-02-02 DIAGNOSIS — H35362 Drusen (degenerative) of macula, left eye: Secondary | ICD-10-CM | POA: Diagnosis not present

## 2024-02-02 DIAGNOSIS — Z0100 Encounter for examination of eyes and vision without abnormal findings: Secondary | ICD-10-CM | POA: Diagnosis not present

## 2024-02-05 DIAGNOSIS — Z860100 Personal history of colon polyps, unspecified: Secondary | ICD-10-CM | POA: Diagnosis not present

## 2024-02-05 DIAGNOSIS — R109 Unspecified abdominal pain: Secondary | ICD-10-CM | POA: Diagnosis not present

## 2024-02-05 DIAGNOSIS — K219 Gastro-esophageal reflux disease without esophagitis: Secondary | ICD-10-CM | POA: Diagnosis not present

## 2024-02-22 DIAGNOSIS — N393 Stress incontinence (female) (male): Secondary | ICD-10-CM | POA: Diagnosis not present

## 2024-02-22 DIAGNOSIS — N3941 Urge incontinence: Secondary | ICD-10-CM | POA: Diagnosis not present

## 2024-02-22 DIAGNOSIS — N3281 Overactive bladder: Secondary | ICD-10-CM | POA: Diagnosis not present

## 2024-02-29 DIAGNOSIS — E7800 Pure hypercholesterolemia, unspecified: Secondary | ICD-10-CM | POA: Diagnosis not present

## 2024-02-29 DIAGNOSIS — I10 Essential (primary) hypertension: Secondary | ICD-10-CM | POA: Diagnosis not present

## 2024-02-29 DIAGNOSIS — I2510 Atherosclerotic heart disease of native coronary artery without angina pectoris: Secondary | ICD-10-CM | POA: Diagnosis not present

## 2024-03-17 DIAGNOSIS — K449 Diaphragmatic hernia without obstruction or gangrene: Secondary | ICD-10-CM | POA: Diagnosis not present

## 2024-03-17 DIAGNOSIS — K2970 Gastritis, unspecified, without bleeding: Secondary | ICD-10-CM | POA: Diagnosis not present

## 2024-03-17 DIAGNOSIS — K2930 Chronic superficial gastritis without bleeding: Secondary | ICD-10-CM | POA: Diagnosis not present

## 2024-03-17 DIAGNOSIS — K219 Gastro-esophageal reflux disease without esophagitis: Secondary | ICD-10-CM | POA: Diagnosis not present

## 2024-03-17 DIAGNOSIS — K222 Esophageal obstruction: Secondary | ICD-10-CM | POA: Diagnosis not present

## 2024-03-17 DIAGNOSIS — R1013 Epigastric pain: Secondary | ICD-10-CM | POA: Diagnosis not present

## 2024-03-22 DIAGNOSIS — K2930 Chronic superficial gastritis without bleeding: Secondary | ICD-10-CM | POA: Diagnosis not present

## 2024-04-19 DIAGNOSIS — R7303 Prediabetes: Secondary | ICD-10-CM | POA: Diagnosis not present

## 2024-04-28 DIAGNOSIS — Z8669 Personal history of other diseases of the nervous system and sense organs: Secondary | ICD-10-CM | POA: Diagnosis not present

## 2024-04-28 DIAGNOSIS — H43811 Vitreous degeneration, right eye: Secondary | ICD-10-CM | POA: Diagnosis not present

## 2024-04-28 DIAGNOSIS — Z83511 Family history of glaucoma: Secondary | ICD-10-CM | POA: Diagnosis not present

## 2024-04-28 DIAGNOSIS — H2511 Age-related nuclear cataract, right eye: Secondary | ICD-10-CM | POA: Diagnosis not present

## 2024-04-28 DIAGNOSIS — H353131 Nonexudative age-related macular degeneration, bilateral, early dry stage: Secondary | ICD-10-CM | POA: Diagnosis not present

## 2024-04-28 DIAGNOSIS — Z83518 Family history of other specified eye disorder: Secondary | ICD-10-CM | POA: Diagnosis not present

## 2024-04-28 DIAGNOSIS — Z9889 Other specified postprocedural states: Secondary | ICD-10-CM | POA: Diagnosis not present

## 2024-05-31 DIAGNOSIS — Z961 Presence of intraocular lens: Secondary | ICD-10-CM | POA: Diagnosis not present

## 2024-05-31 DIAGNOSIS — H25811 Combined forms of age-related cataract, right eye: Secondary | ICD-10-CM | POA: Diagnosis not present

## 2024-05-31 DIAGNOSIS — H353131 Nonexudative age-related macular degeneration, bilateral, early dry stage: Secondary | ICD-10-CM | POA: Diagnosis not present

## 2024-06-16 DIAGNOSIS — I10 Essential (primary) hypertension: Secondary | ICD-10-CM | POA: Diagnosis not present

## 2024-06-16 DIAGNOSIS — E538 Deficiency of other specified B group vitamins: Secondary | ICD-10-CM | POA: Diagnosis not present

## 2024-06-16 DIAGNOSIS — Z23 Encounter for immunization: Secondary | ICD-10-CM | POA: Diagnosis not present

## 2024-06-16 DIAGNOSIS — R296 Repeated falls: Secondary | ICD-10-CM | POA: Diagnosis not present

## 2024-07-11 DIAGNOSIS — H25811 Combined forms of age-related cataract, right eye: Secondary | ICD-10-CM | POA: Diagnosis not present

## 2024-08-05 DIAGNOSIS — N393 Stress incontinence (female) (male): Secondary | ICD-10-CM | POA: Diagnosis not present

## 2024-08-05 DIAGNOSIS — N39498 Other specified urinary incontinence: Secondary | ICD-10-CM | POA: Diagnosis not present

## 2024-08-05 DIAGNOSIS — N3941 Urge incontinence: Secondary | ICD-10-CM | POA: Diagnosis not present

## 2024-08-29 DIAGNOSIS — I10 Essential (primary) hypertension: Secondary | ICD-10-CM | POA: Diagnosis not present

## 2024-08-29 DIAGNOSIS — Z8669 Personal history of other diseases of the nervous system and sense organs: Secondary | ICD-10-CM | POA: Diagnosis not present

## 2024-08-29 DIAGNOSIS — H43811 Vitreous degeneration, right eye: Secondary | ICD-10-CM | POA: Diagnosis not present

## 2024-08-29 DIAGNOSIS — H353131 Nonexudative age-related macular degeneration, bilateral, early dry stage: Secondary | ICD-10-CM | POA: Diagnosis not present

## 2024-10-17 ENCOUNTER — Other Ambulatory Visit: Payer: Self-pay | Admitting: Internal Medicine

## 2024-10-17 DIAGNOSIS — Z1231 Encounter for screening mammogram for malignant neoplasm of breast: Secondary | ICD-10-CM

## 2024-11-21 ENCOUNTER — Ambulatory Visit
